# Patient Record
Sex: Female | Born: 1955 | Race: White | Hispanic: No | Marital: Married | Smoking: Current every day smoker
Health system: Southern US, Community
[De-identification: ages and names within clinical notes are randomized; demographics above are authoritative.]

## PROBLEM LIST (undated history)

## (undated) DIAGNOSIS — C801 Malignant (primary) neoplasm, unspecified: Secondary | ICD-10-CM

## (undated) DIAGNOSIS — E119 Type 2 diabetes mellitus without complications: Secondary | ICD-10-CM

## (undated) DIAGNOSIS — C50919 Malignant neoplasm of unspecified site of unspecified female breast: Secondary | ICD-10-CM

## (undated) DIAGNOSIS — E785 Hyperlipidemia, unspecified: Secondary | ICD-10-CM

## (undated) DIAGNOSIS — C8190 Hodgkin lymphoma, unspecified, unspecified site: Secondary | ICD-10-CM

## (undated) DIAGNOSIS — J449 Chronic obstructive pulmonary disease, unspecified: Secondary | ICD-10-CM

## (undated) HISTORY — DX: Hodgkin lymphoma, unspecified, unspecified site: C81.90

## (undated) HISTORY — DX: Malignant neoplasm of unspecified site of unspecified female breast: C50.919

## (undated) HISTORY — PX: MASTECTOMY: SHX3

## (undated) HISTORY — DX: Malignant (primary) neoplasm, unspecified: C80.1

## (undated) HISTORY — DX: Chronic obstructive pulmonary disease, unspecified: J44.9

## (undated) HISTORY — DX: Type 2 diabetes mellitus without complications: E11.9

## (undated) HISTORY — DX: Hyperlipidemia, unspecified: E78.5

## (undated) HISTORY — PX: CARPAL TUNNEL RELEASE: SHX101

## (undated) HISTORY — PX: LIMBAL STEM CELL TRANSPLANT: SHX1969

## (undated) HISTORY — PX: OVARIAN CYST SURGERY: SHX726

## (undated) HISTORY — PX: PORTA CATH INSERTION: CATH118285

---

## 2003-01-06 ENCOUNTER — Ambulatory Visit: Admission: RE | Admit: 2003-01-06 | Discharge: 2003-02-24 | Payer: Self-pay | Admitting: Radiation Oncology

## 2003-03-24 ENCOUNTER — Ambulatory Visit: Admission: RE | Admit: 2003-03-24 | Discharge: 2003-03-24 | Payer: Self-pay | Admitting: Family Medicine

## 2003-04-06 ENCOUNTER — Ambulatory Visit (HOSPITAL_COMMUNITY): Admission: RE | Admit: 2003-04-06 | Discharge: 2003-04-06 | Payer: Self-pay | Admitting: Oncology

## 2003-06-14 ENCOUNTER — Ambulatory Visit (HOSPITAL_COMMUNITY): Admission: RE | Admit: 2003-06-14 | Discharge: 2003-06-14 | Payer: Self-pay | Admitting: *Deleted

## 2004-04-30 ENCOUNTER — Ambulatory Visit: Payer: Self-pay | Admitting: Oncology

## 2005-06-11 IMAGING — CT CT CHEST W/ CM
1 of 4 series · 13 of 32 positions shown, 18 images · IV contrast (omnipaque)
Comparison: none

CLINICAL DATA: Hodgkin's lymphoma/staging.
 CT CHEST, ABDOMEN, AND PELVIS WITH CONTRAST
TECHNIQUE: Multidetector helical imaging carried out through the chest, abdomen, and pelvis utilizing oral and IV contrast (150 cc Omnipaque 300).  The patient has no prior CTs of these body parts available for comparison.  She did bring in a CD-ROM from [HOSPITAL], but that study is of the neck only.  
 CT CHEST WITH CONTRAST 
 There is anterior mediastinal adenopathy.   On image #12, there is a prevascular mass with a short axis length of about 22 mm.  On image #16, there is a rounded mass in the anterior mediastinum measuring about 25 mm in greatest diameter.  On image #23, there is an anterior mediastinal mass anterior to the ascending aorta with a short axis measurement of about 17 mm.   On image #29, there is a right infrahilar node measuring approximately 26 x 16 mm.  At this time, there is no pleural or paracardial fluid.  The lungs are clear except for an 11 x 14  mm density at the right lung base seen on image #38.  This is in the central lung base and becomes contiguous with the diaphragm on lower cuts.  This may be some "round atelectasis" or pneumonia at the right base, but lymphomatous involvement of the right lung cannot be excluded.  PET CT might be of value to further distinguish between these two entities.

[Series 4: a/p 5.0 b30f · axial · 0.74mm/px · z∈[-662,-272]mm · 13 of 90 slices shown, 18 images]
[im 6/90  soft-tissue]
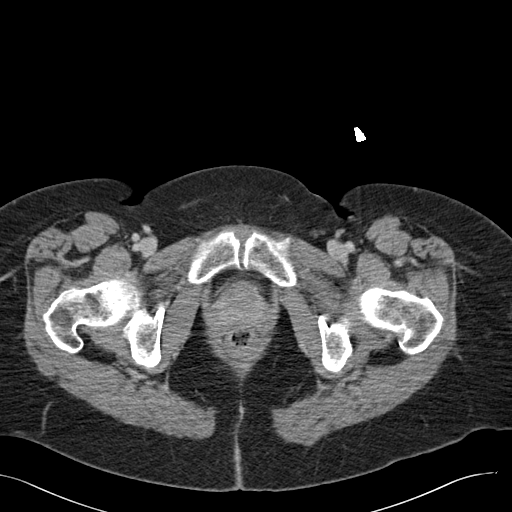
[im 6/90  bone]
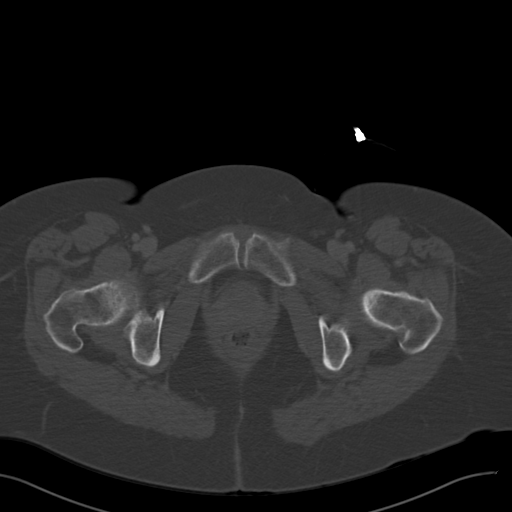
[im 12/90  soft-tissue]
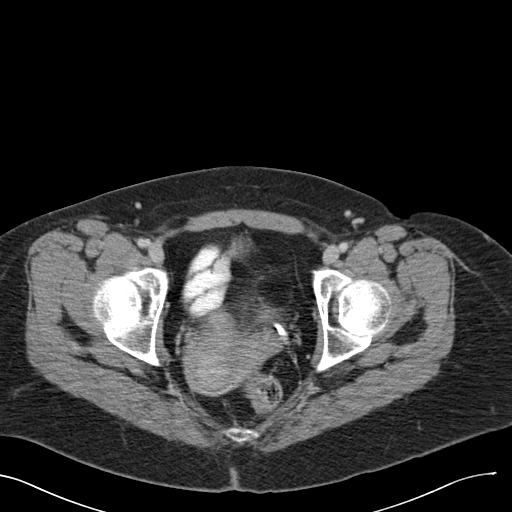
[im 18/90  soft-tissue]
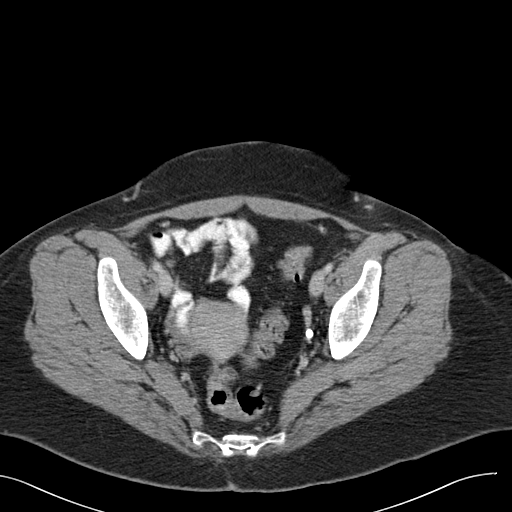
[im 30/90  soft-tissue]
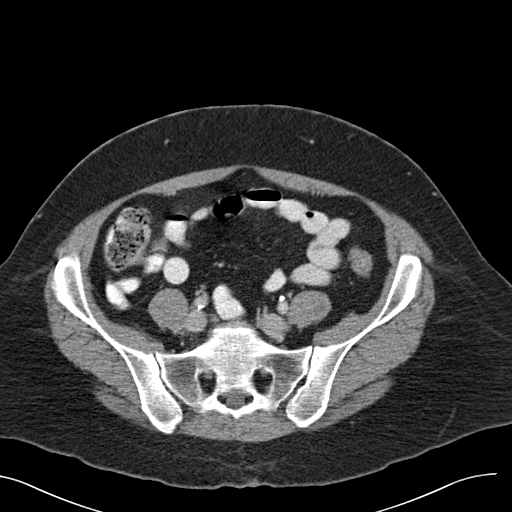
[im 36/90  soft-tissue]
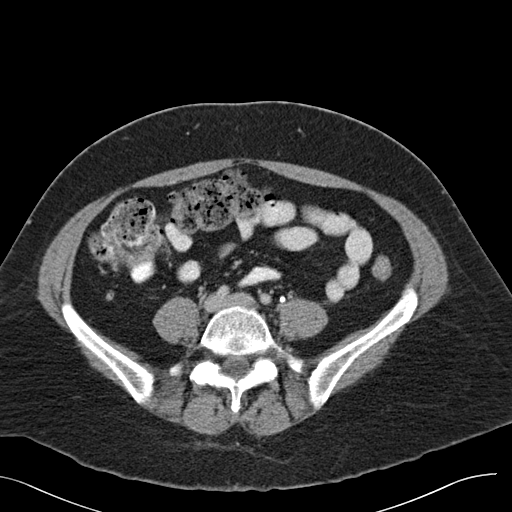
[im 42/90  soft-tissue]
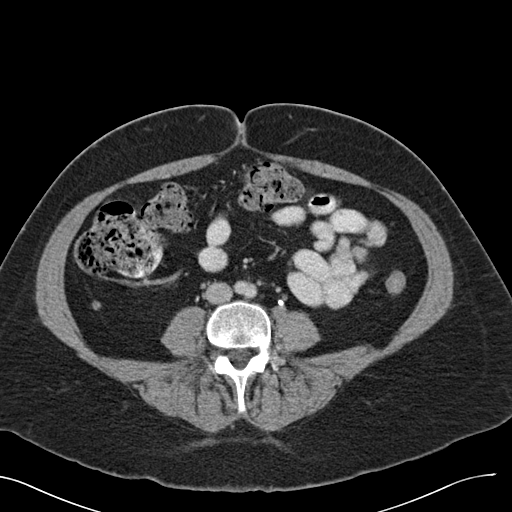
[im 48/90  soft-tissue]
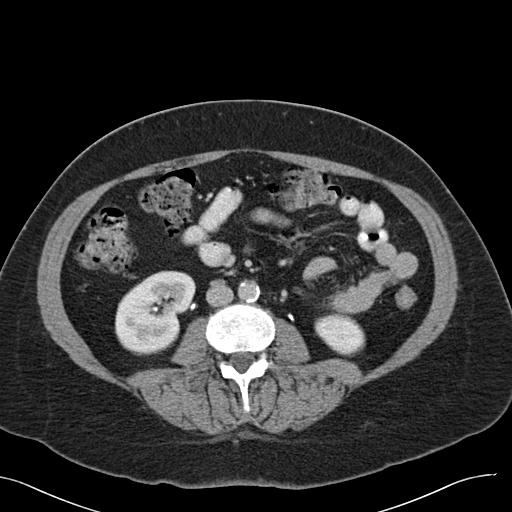
[im 54/90  soft-tissue]
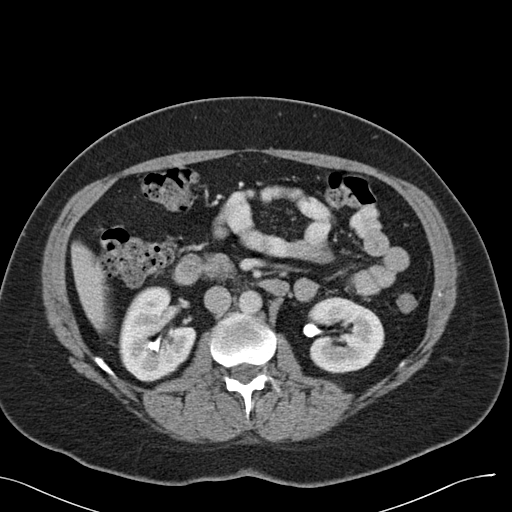
[im 60/90  soft-tissue]
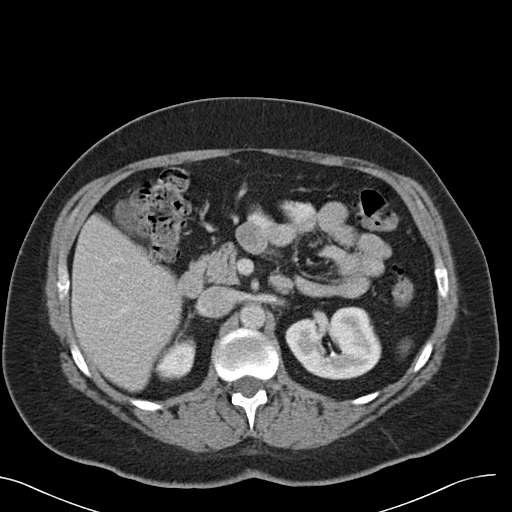
[im 60/90  bone]
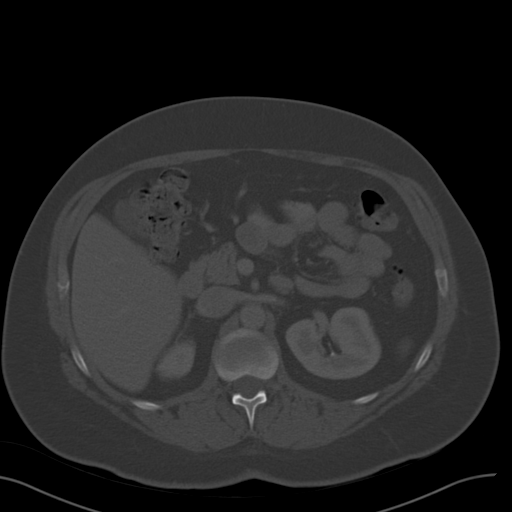
[im 66/90  lung]
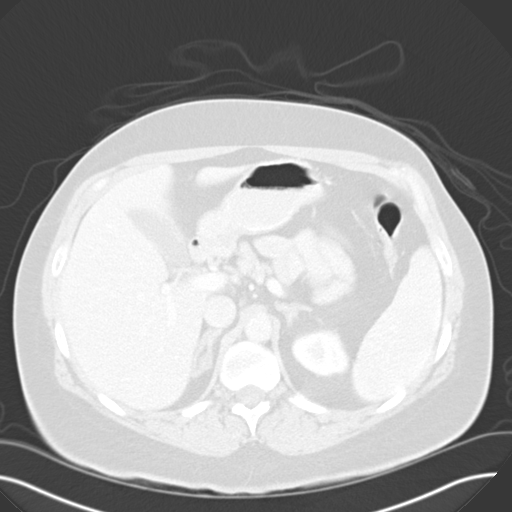
[im 72/90  soft-tissue]
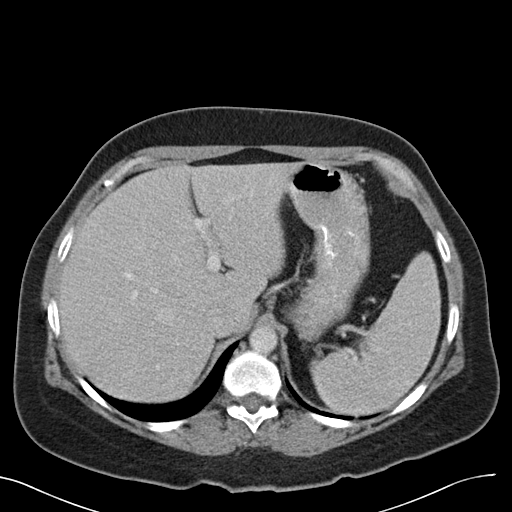
[im 72/90  lung]
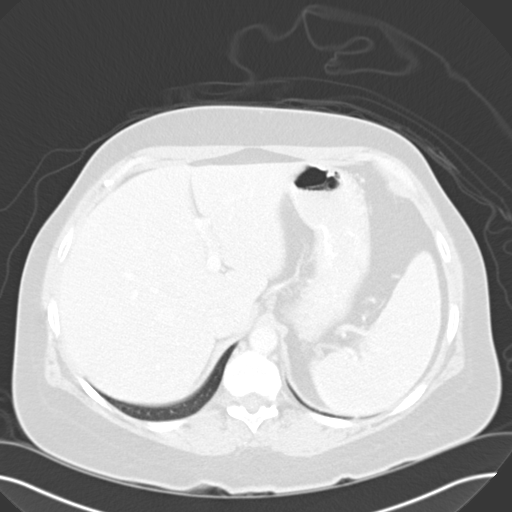
[im 78/90  soft-tissue]
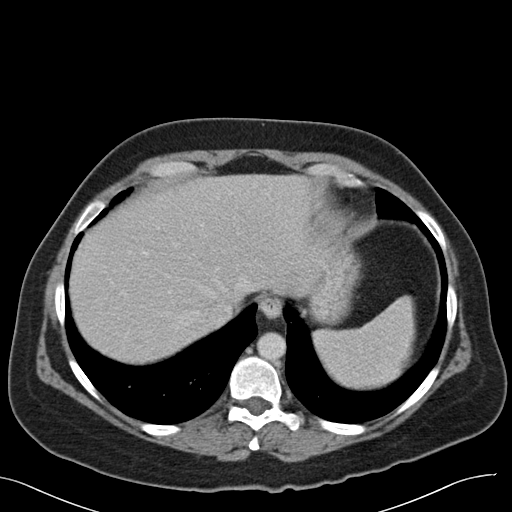
[im 78/90  lung]
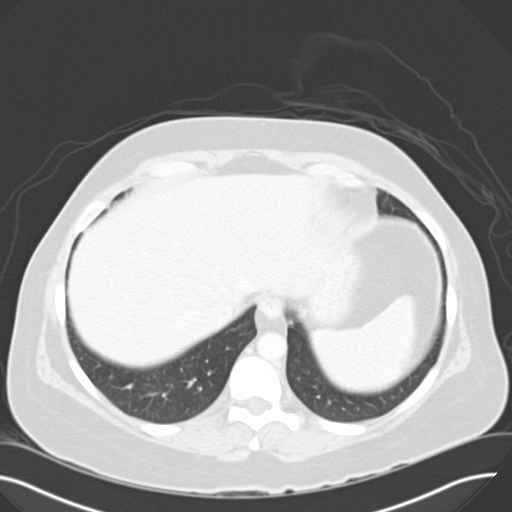
[im 84/90  soft-tissue]
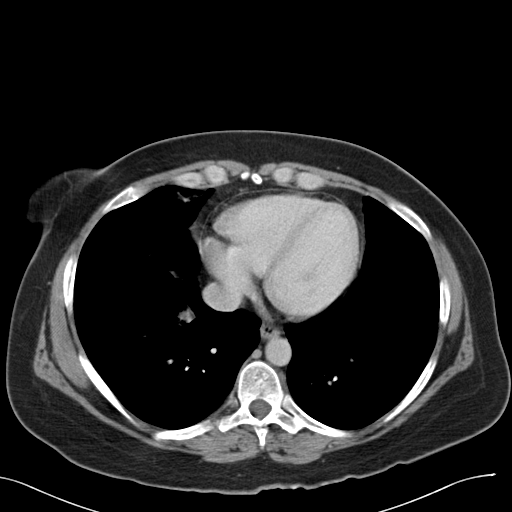
[im 84/90  lung]
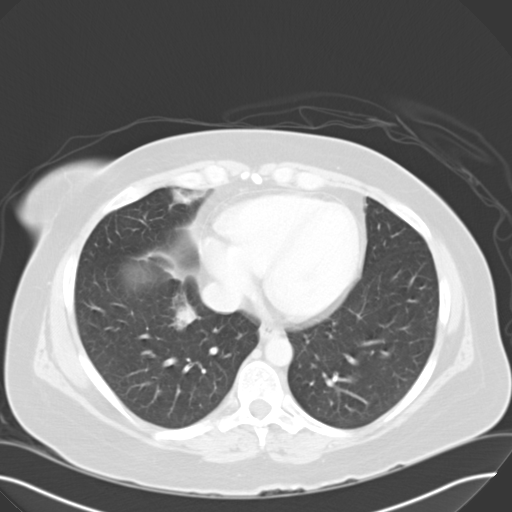

[13 of 32 positions shown; findings below may reference images not displayed]

One other observation, inadvertently not mentioned, is that there is also a subcarinal adenopathy.  This extends down along the posterior aspect of the right mainstem bronchus. 
 IMPRESSION
 1.  Mediastinal and right hilar/infrahilar adenopathy as described above.
 2.  Density at the right lung base -- question atelectasis/pneumonia versus lymphomatous lung involvement.
 CT ABDOMEN WITH CONTRAST 
 No focal lesions of the liver.   Spleen is within the normal limits of size and contour.  No calcified gallstones or biliary dilatation.  Pancreas and adrenals normal.  No retroperitoneal or mesenteric adenopathy.  No masses or ascites.   
 IMPRESSION
 No pathological findings in the abdomen.
 CT PELVIS WITH CONTRAST 
 No focal masses, adenopathy, or fluid collections.  No ureteral dilatation.  The appendix is noted to be normal.  No mesenteric masses. 
 IMPRESSION
 No pathological findings in the pelvis.

## 2006-02-21 ENCOUNTER — Ambulatory Visit: Payer: Self-pay | Admitting: Oncology

## 2006-08-08 ENCOUNTER — Ambulatory Visit: Payer: Self-pay | Admitting: Oncology

## 2009-05-29 ENCOUNTER — Ambulatory Visit (HOSPITAL_BASED_OUTPATIENT_CLINIC_OR_DEPARTMENT_OTHER): Admission: RE | Admit: 2009-05-29 | Discharge: 2009-05-29 | Payer: Self-pay | Admitting: Specialist

## 2010-08-12 LAB — GLUCOSE, CAPILLARY
Glucose-Capillary: 173 mg/dL — ABNORMAL HIGH (ref 70–99)
Glucose-Capillary: 179 mg/dL — ABNORMAL HIGH (ref 70–99)

## 2010-08-12 LAB — POCT HEMOGLOBIN-HEMACUE: Hemoglobin: 13.5 g/dL (ref 12.0–15.0)

## 2010-08-27 LAB — BASIC METABOLIC PANEL
BUN: 12 mg/dL (ref 6–23)
CO2: 27 mEq/L (ref 19–32)
Calcium: 9.4 mg/dL (ref 8.4–10.5)
Chloride: 100 mEq/L (ref 96–112)
Creatinine, Ser: 0.7 mg/dL (ref 0.4–1.2)
GFR calc Af Amer: >60 mL/min (ref >60)
GFR calc non Af Amer: >60 mL/min (ref >60)
Glucose, Bld: 224 mg/dL — ABNORMAL HIGH (ref 70–99)
Potassium: 4 mEq/L (ref 3.5–5.1)
Sodium: 134 mEq/L — ABNORMAL LOW (ref 135–145)

## 2016-04-17 DIAGNOSIS — F17210 Nicotine dependence, cigarettes, uncomplicated: Secondary | ICD-10-CM | POA: Diagnosis not present

## 2016-04-17 DIAGNOSIS — M899 Disorder of bone, unspecified: Secondary | ICD-10-CM | POA: Diagnosis not present

## 2016-04-17 DIAGNOSIS — Z8571 Personal history of Hodgkin lymphoma: Secondary | ICD-10-CM | POA: Diagnosis not present

## 2016-04-17 DIAGNOSIS — Z853 Personal history of malignant neoplasm of breast: Secondary | ICD-10-CM | POA: Diagnosis not present

## 2017-01-13 DIAGNOSIS — Z0000 Encounter for general adult medical examination without abnormal findings: Secondary | ICD-10-CM | POA: Diagnosis not present

## 2017-01-13 DIAGNOSIS — E785 Hyperlipidemia, unspecified: Secondary | ICD-10-CM | POA: Diagnosis not present

## 2017-01-13 DIAGNOSIS — L989 Disorder of the skin and subcutaneous tissue, unspecified: Secondary | ICD-10-CM | POA: Diagnosis not present

## 2017-01-13 DIAGNOSIS — E119 Type 2 diabetes mellitus without complications: Secondary | ICD-10-CM | POA: Diagnosis not present

## 2017-01-13 DIAGNOSIS — M1712 Unilateral primary osteoarthritis, left knee: Secondary | ICD-10-CM | POA: Diagnosis not present

## 2017-01-13 DIAGNOSIS — J449 Chronic obstructive pulmonary disease, unspecified: Secondary | ICD-10-CM | POA: Diagnosis not present

## 2017-01-13 DIAGNOSIS — M19011 Primary osteoarthritis, right shoulder: Secondary | ICD-10-CM | POA: Diagnosis not present

## 2017-01-13 DIAGNOSIS — G8911 Acute pain due to trauma: Secondary | ICD-10-CM | POA: Diagnosis not present

## 2017-01-13 DIAGNOSIS — R69 Illness, unspecified: Secondary | ICD-10-CM | POA: Diagnosis not present

## 2017-01-13 DIAGNOSIS — R221 Localized swelling, mass and lump, neck: Secondary | ICD-10-CM | POA: Diagnosis not present

## 2017-03-14 DIAGNOSIS — R69 Illness, unspecified: Secondary | ICD-10-CM | POA: Diagnosis not present

## 2017-04-16 DIAGNOSIS — Z1231 Encounter for screening mammogram for malignant neoplasm of breast: Secondary | ICD-10-CM | POA: Diagnosis not present

## 2017-04-30 DIAGNOSIS — N6489 Other specified disorders of breast: Secondary | ICD-10-CM | POA: Diagnosis not present

## 2017-04-30 DIAGNOSIS — N6311 Unspecified lump in the right breast, upper outer quadrant: Secondary | ICD-10-CM | POA: Diagnosis not present

## 2017-04-30 DIAGNOSIS — R928 Other abnormal and inconclusive findings on diagnostic imaging of breast: Secondary | ICD-10-CM | POA: Diagnosis not present

## 2017-04-30 DIAGNOSIS — R922 Inconclusive mammogram: Secondary | ICD-10-CM | POA: Diagnosis not present

## 2017-05-07 DIAGNOSIS — Z9012 Acquired absence of left breast and nipple: Secondary | ICD-10-CM | POA: Diagnosis not present

## 2017-05-07 DIAGNOSIS — C50411 Malignant neoplasm of upper-outer quadrant of right female breast: Secondary | ICD-10-CM | POA: Diagnosis not present

## 2017-05-07 DIAGNOSIS — Z8571 Personal history of Hodgkin lymphoma: Secondary | ICD-10-CM | POA: Diagnosis not present

## 2017-05-07 DIAGNOSIS — N6311 Unspecified lump in the right breast, upper outer quadrant: Secondary | ICD-10-CM | POA: Diagnosis not present

## 2017-05-13 DIAGNOSIS — C50411 Malignant neoplasm of upper-outer quadrant of right female breast: Secondary | ICD-10-CM | POA: Diagnosis not present

## 2017-05-13 DIAGNOSIS — Z170 Estrogen receptor positive status [ER+]: Secondary | ICD-10-CM | POA: Diagnosis not present

## 2017-05-23 DIAGNOSIS — E079 Disorder of thyroid, unspecified: Secondary | ICD-10-CM | POA: Diagnosis not present

## 2017-05-23 DIAGNOSIS — E7800 Pure hypercholesterolemia, unspecified: Secondary | ICD-10-CM | POA: Diagnosis not present

## 2017-05-23 DIAGNOSIS — E1122 Type 2 diabetes mellitus with diabetic chronic kidney disease: Secondary | ICD-10-CM | POA: Diagnosis not present

## 2017-05-23 DIAGNOSIS — C50911 Malignant neoplasm of unspecified site of right female breast: Secondary | ICD-10-CM | POA: Diagnosis not present

## 2017-05-23 DIAGNOSIS — C50919 Malignant neoplasm of unspecified site of unspecified female breast: Secondary | ICD-10-CM | POA: Diagnosis not present

## 2017-05-23 DIAGNOSIS — I129 Hypertensive chronic kidney disease with stage 1 through stage 4 chronic kidney disease, or unspecified chronic kidney disease: Secondary | ICD-10-CM | POA: Diagnosis not present

## 2017-05-23 DIAGNOSIS — I10 Essential (primary) hypertension: Secondary | ICD-10-CM | POA: Diagnosis not present

## 2017-05-23 DIAGNOSIS — Z170 Estrogen receptor positive status [ER+]: Secondary | ICD-10-CM | POA: Diagnosis not present

## 2017-05-23 DIAGNOSIS — E119 Type 2 diabetes mellitus without complications: Secondary | ICD-10-CM | POA: Diagnosis not present

## 2017-05-23 DIAGNOSIS — D696 Thrombocytopenia, unspecified: Secondary | ICD-10-CM | POA: Diagnosis not present

## 2017-05-23 DIAGNOSIS — E559 Vitamin D deficiency, unspecified: Secondary | ICD-10-CM | POA: Diagnosis not present

## 2017-05-23 DIAGNOSIS — J449 Chronic obstructive pulmonary disease, unspecified: Secondary | ICD-10-CM | POA: Diagnosis not present

## 2017-05-23 DIAGNOSIS — C50411 Malignant neoplasm of upper-outer quadrant of right female breast: Secondary | ICD-10-CM | POA: Diagnosis not present

## 2017-05-23 DIAGNOSIS — N182 Chronic kidney disease, stage 2 (mild): Secondary | ICD-10-CM | POA: Diagnosis not present

## 2017-06-12 DIAGNOSIS — Z923 Personal history of irradiation: Secondary | ICD-10-CM | POA: Diagnosis not present

## 2017-06-12 DIAGNOSIS — Z8571 Personal history of Hodgkin lymphoma: Secondary | ICD-10-CM | POA: Diagnosis not present

## 2017-06-12 DIAGNOSIS — F17210 Nicotine dependence, cigarettes, uncomplicated: Secondary | ICD-10-CM | POA: Diagnosis not present

## 2017-06-12 DIAGNOSIS — Z9221 Personal history of antineoplastic chemotherapy: Secondary | ICD-10-CM | POA: Diagnosis not present

## 2017-06-12 DIAGNOSIS — R69 Illness, unspecified: Secondary | ICD-10-CM | POA: Diagnosis not present

## 2017-06-12 DIAGNOSIS — Z9484 Stem cells transplant status: Secondary | ICD-10-CM | POA: Diagnosis not present

## 2017-06-12 DIAGNOSIS — C50911 Malignant neoplasm of unspecified site of right female breast: Secondary | ICD-10-CM | POA: Diagnosis not present

## 2017-06-12 DIAGNOSIS — E871 Hypo-osmolality and hyponatremia: Secondary | ICD-10-CM | POA: Diagnosis not present

## 2017-06-12 DIAGNOSIS — Z853 Personal history of malignant neoplasm of breast: Secondary | ICD-10-CM | POA: Diagnosis not present

## 2017-06-12 DIAGNOSIS — M8580 Other specified disorders of bone density and structure, unspecified site: Secondary | ICD-10-CM | POA: Diagnosis not present

## 2017-06-12 DIAGNOSIS — E1165 Type 2 diabetes mellitus with hyperglycemia: Secondary | ICD-10-CM | POA: Diagnosis not present

## 2017-06-23 DIAGNOSIS — M8589 Other specified disorders of bone density and structure, multiple sites: Secondary | ICD-10-CM | POA: Diagnosis not present

## 2017-06-27 DIAGNOSIS — Z170 Estrogen receptor positive status [ER+]: Secondary | ICD-10-CM

## 2017-06-27 DIAGNOSIS — R69 Illness, unspecified: Secondary | ICD-10-CM | POA: Diagnosis not present

## 2017-06-27 DIAGNOSIS — M8580 Other specified disorders of bone density and structure, unspecified site: Secondary | ICD-10-CM | POA: Diagnosis not present

## 2017-06-27 DIAGNOSIS — E119 Type 2 diabetes mellitus without complications: Secondary | ICD-10-CM | POA: Diagnosis not present

## 2017-06-27 DIAGNOSIS — C50411 Malignant neoplasm of upper-outer quadrant of right female breast: Secondary | ICD-10-CM | POA: Diagnosis not present

## 2017-06-27 DIAGNOSIS — Z8571 Personal history of Hodgkin lymphoma: Secondary | ICD-10-CM

## 2017-06-27 DIAGNOSIS — E1165 Type 2 diabetes mellitus with hyperglycemia: Secondary | ICD-10-CM | POA: Diagnosis not present

## 2017-06-27 DIAGNOSIS — C50911 Malignant neoplasm of unspecified site of right female breast: Secondary | ICD-10-CM | POA: Diagnosis not present

## 2017-06-27 DIAGNOSIS — Z853 Personal history of malignant neoplasm of breast: Secondary | ICD-10-CM | POA: Diagnosis not present

## 2017-06-27 DIAGNOSIS — F17210 Nicotine dependence, cigarettes, uncomplicated: Secondary | ICD-10-CM | POA: Diagnosis not present

## 2017-06-27 DIAGNOSIS — M8589 Other specified disorders of bone density and structure, multiple sites: Secondary | ICD-10-CM | POA: Diagnosis not present

## 2017-08-21 DIAGNOSIS — M6281 Muscle weakness (generalized): Secondary | ICD-10-CM | POA: Diagnosis not present

## 2017-08-21 DIAGNOSIS — M25612 Stiffness of left shoulder, not elsewhere classified: Secondary | ICD-10-CM | POA: Diagnosis not present

## 2017-08-21 DIAGNOSIS — R5383 Other fatigue: Secondary | ICD-10-CM | POA: Diagnosis not present

## 2017-08-21 DIAGNOSIS — C50411 Malignant neoplasm of upper-outer quadrant of right female breast: Secondary | ICD-10-CM | POA: Diagnosis not present

## 2017-08-21 DIAGNOSIS — R2681 Unsteadiness on feet: Secondary | ICD-10-CM | POA: Diagnosis not present

## 2017-08-21 DIAGNOSIS — M25611 Stiffness of right shoulder, not elsewhere classified: Secondary | ICD-10-CM | POA: Diagnosis not present

## 2017-08-21 DIAGNOSIS — R2689 Other abnormalities of gait and mobility: Secondary | ICD-10-CM | POA: Diagnosis not present

## 2017-08-21 DIAGNOSIS — R293 Abnormal posture: Secondary | ICD-10-CM | POA: Diagnosis not present

## 2017-08-26 DIAGNOSIS — M25612 Stiffness of left shoulder, not elsewhere classified: Secondary | ICD-10-CM | POA: Diagnosis not present

## 2017-08-26 DIAGNOSIS — R2689 Other abnormalities of gait and mobility: Secondary | ICD-10-CM | POA: Diagnosis not present

## 2017-08-26 DIAGNOSIS — R2681 Unsteadiness on feet: Secondary | ICD-10-CM | POA: Diagnosis not present

## 2017-08-26 DIAGNOSIS — R5383 Other fatigue: Secondary | ICD-10-CM | POA: Diagnosis not present

## 2017-08-26 DIAGNOSIS — C50411 Malignant neoplasm of upper-outer quadrant of right female breast: Secondary | ICD-10-CM | POA: Diagnosis not present

## 2017-08-26 DIAGNOSIS — M6281 Muscle weakness (generalized): Secondary | ICD-10-CM | POA: Diagnosis not present

## 2017-08-26 DIAGNOSIS — R293 Abnormal posture: Secondary | ICD-10-CM | POA: Diagnosis not present

## 2017-08-26 DIAGNOSIS — M25611 Stiffness of right shoulder, not elsewhere classified: Secondary | ICD-10-CM | POA: Diagnosis not present

## 2017-08-28 DIAGNOSIS — R2689 Other abnormalities of gait and mobility: Secondary | ICD-10-CM | POA: Diagnosis not present

## 2017-08-28 DIAGNOSIS — R2681 Unsteadiness on feet: Secondary | ICD-10-CM | POA: Diagnosis not present

## 2017-08-28 DIAGNOSIS — M25612 Stiffness of left shoulder, not elsewhere classified: Secondary | ICD-10-CM | POA: Diagnosis not present

## 2017-08-28 DIAGNOSIS — M6281 Muscle weakness (generalized): Secondary | ICD-10-CM | POA: Diagnosis not present

## 2017-08-28 DIAGNOSIS — M25611 Stiffness of right shoulder, not elsewhere classified: Secondary | ICD-10-CM | POA: Diagnosis not present

## 2017-08-28 DIAGNOSIS — C50411 Malignant neoplasm of upper-outer quadrant of right female breast: Secondary | ICD-10-CM | POA: Diagnosis not present

## 2017-08-28 DIAGNOSIS — R5383 Other fatigue: Secondary | ICD-10-CM | POA: Diagnosis not present

## 2017-08-28 DIAGNOSIS — R293 Abnormal posture: Secondary | ICD-10-CM | POA: Diagnosis not present

## 2017-09-02 DIAGNOSIS — C50411 Malignant neoplasm of upper-outer quadrant of right female breast: Secondary | ICD-10-CM | POA: Diagnosis not present

## 2017-09-02 DIAGNOSIS — R2689 Other abnormalities of gait and mobility: Secondary | ICD-10-CM | POA: Diagnosis not present

## 2017-09-02 DIAGNOSIS — R293 Abnormal posture: Secondary | ICD-10-CM | POA: Diagnosis not present

## 2017-09-02 DIAGNOSIS — M25612 Stiffness of left shoulder, not elsewhere classified: Secondary | ICD-10-CM | POA: Diagnosis not present

## 2017-09-02 DIAGNOSIS — M6281 Muscle weakness (generalized): Secondary | ICD-10-CM | POA: Diagnosis not present

## 2017-09-02 DIAGNOSIS — R5383 Other fatigue: Secondary | ICD-10-CM | POA: Diagnosis not present

## 2017-09-02 DIAGNOSIS — M25611 Stiffness of right shoulder, not elsewhere classified: Secondary | ICD-10-CM | POA: Diagnosis not present

## 2017-09-02 DIAGNOSIS — R2681 Unsteadiness on feet: Secondary | ICD-10-CM | POA: Diagnosis not present

## 2017-09-05 DIAGNOSIS — M25612 Stiffness of left shoulder, not elsewhere classified: Secondary | ICD-10-CM | POA: Diagnosis not present

## 2017-09-05 DIAGNOSIS — R5383 Other fatigue: Secondary | ICD-10-CM | POA: Diagnosis not present

## 2017-09-05 DIAGNOSIS — C50411 Malignant neoplasm of upper-outer quadrant of right female breast: Secondary | ICD-10-CM | POA: Diagnosis not present

## 2017-09-05 DIAGNOSIS — R2689 Other abnormalities of gait and mobility: Secondary | ICD-10-CM | POA: Diagnosis not present

## 2017-09-05 DIAGNOSIS — M25611 Stiffness of right shoulder, not elsewhere classified: Secondary | ICD-10-CM | POA: Diagnosis not present

## 2017-09-05 DIAGNOSIS — M6281 Muscle weakness (generalized): Secondary | ICD-10-CM | POA: Diagnosis not present

## 2017-09-05 DIAGNOSIS — R2681 Unsteadiness on feet: Secondary | ICD-10-CM | POA: Diagnosis not present

## 2017-09-05 DIAGNOSIS — R293 Abnormal posture: Secondary | ICD-10-CM | POA: Diagnosis not present

## 2017-09-09 DIAGNOSIS — M6281 Muscle weakness (generalized): Secondary | ICD-10-CM | POA: Diagnosis not present

## 2017-09-09 DIAGNOSIS — M25611 Stiffness of right shoulder, not elsewhere classified: Secondary | ICD-10-CM | POA: Diagnosis not present

## 2017-09-09 DIAGNOSIS — M25612 Stiffness of left shoulder, not elsewhere classified: Secondary | ICD-10-CM | POA: Diagnosis not present

## 2017-09-09 DIAGNOSIS — C50411 Malignant neoplasm of upper-outer quadrant of right female breast: Secondary | ICD-10-CM | POA: Diagnosis not present

## 2017-09-09 DIAGNOSIS — R2689 Other abnormalities of gait and mobility: Secondary | ICD-10-CM | POA: Diagnosis not present

## 2017-09-09 DIAGNOSIS — R293 Abnormal posture: Secondary | ICD-10-CM | POA: Diagnosis not present

## 2017-09-09 DIAGNOSIS — R2681 Unsteadiness on feet: Secondary | ICD-10-CM | POA: Diagnosis not present

## 2017-09-09 DIAGNOSIS — R5383 Other fatigue: Secondary | ICD-10-CM | POA: Diagnosis not present

## 2017-09-18 DIAGNOSIS — C50411 Malignant neoplasm of upper-outer quadrant of right female breast: Secondary | ICD-10-CM | POA: Diagnosis not present

## 2017-09-18 DIAGNOSIS — M25611 Stiffness of right shoulder, not elsewhere classified: Secondary | ICD-10-CM | POA: Diagnosis not present

## 2017-09-18 DIAGNOSIS — M25612 Stiffness of left shoulder, not elsewhere classified: Secondary | ICD-10-CM | POA: Diagnosis not present

## 2017-09-18 DIAGNOSIS — M6281 Muscle weakness (generalized): Secondary | ICD-10-CM | POA: Diagnosis not present

## 2017-09-18 DIAGNOSIS — R2689 Other abnormalities of gait and mobility: Secondary | ICD-10-CM | POA: Diagnosis not present

## 2017-09-18 DIAGNOSIS — R5383 Other fatigue: Secondary | ICD-10-CM | POA: Diagnosis not present

## 2017-09-18 DIAGNOSIS — R293 Abnormal posture: Secondary | ICD-10-CM | POA: Diagnosis not present

## 2017-09-18 DIAGNOSIS — R2681 Unsteadiness on feet: Secondary | ICD-10-CM | POA: Diagnosis not present

## 2017-09-24 DIAGNOSIS — M25612 Stiffness of left shoulder, not elsewhere classified: Secondary | ICD-10-CM | POA: Diagnosis not present

## 2017-09-24 DIAGNOSIS — C50411 Malignant neoplasm of upper-outer quadrant of right female breast: Secondary | ICD-10-CM | POA: Diagnosis not present

## 2017-09-24 DIAGNOSIS — R293 Abnormal posture: Secondary | ICD-10-CM | POA: Diagnosis not present

## 2017-09-24 DIAGNOSIS — R2681 Unsteadiness on feet: Secondary | ICD-10-CM | POA: Diagnosis not present

## 2017-09-24 DIAGNOSIS — R2689 Other abnormalities of gait and mobility: Secondary | ICD-10-CM | POA: Diagnosis not present

## 2017-09-24 DIAGNOSIS — M25611 Stiffness of right shoulder, not elsewhere classified: Secondary | ICD-10-CM | POA: Diagnosis not present

## 2017-09-24 DIAGNOSIS — R5383 Other fatigue: Secondary | ICD-10-CM | POA: Diagnosis not present

## 2017-09-24 DIAGNOSIS — M6281 Muscle weakness (generalized): Secondary | ICD-10-CM | POA: Diagnosis not present

## 2017-09-26 DIAGNOSIS — M6281 Muscle weakness (generalized): Secondary | ICD-10-CM | POA: Diagnosis not present

## 2017-09-26 DIAGNOSIS — C50411 Malignant neoplasm of upper-outer quadrant of right female breast: Secondary | ICD-10-CM | POA: Diagnosis not present

## 2017-09-26 DIAGNOSIS — M25611 Stiffness of right shoulder, not elsewhere classified: Secondary | ICD-10-CM | POA: Diagnosis not present

## 2017-09-26 DIAGNOSIS — R2689 Other abnormalities of gait and mobility: Secondary | ICD-10-CM | POA: Diagnosis not present

## 2017-09-26 DIAGNOSIS — M25612 Stiffness of left shoulder, not elsewhere classified: Secondary | ICD-10-CM | POA: Diagnosis not present

## 2017-09-26 DIAGNOSIS — R293 Abnormal posture: Secondary | ICD-10-CM | POA: Diagnosis not present

## 2017-09-26 DIAGNOSIS — R2681 Unsteadiness on feet: Secondary | ICD-10-CM | POA: Diagnosis not present

## 2017-09-26 DIAGNOSIS — R5383 Other fatigue: Secondary | ICD-10-CM | POA: Diagnosis not present

## 2017-10-01 DIAGNOSIS — R293 Abnormal posture: Secondary | ICD-10-CM | POA: Diagnosis not present

## 2017-10-01 DIAGNOSIS — M25612 Stiffness of left shoulder, not elsewhere classified: Secondary | ICD-10-CM | POA: Diagnosis not present

## 2017-10-01 DIAGNOSIS — R5383 Other fatigue: Secondary | ICD-10-CM | POA: Diagnosis not present

## 2017-10-01 DIAGNOSIS — M25611 Stiffness of right shoulder, not elsewhere classified: Secondary | ICD-10-CM | POA: Diagnosis not present

## 2017-10-01 DIAGNOSIS — R2689 Other abnormalities of gait and mobility: Secondary | ICD-10-CM | POA: Diagnosis not present

## 2017-10-01 DIAGNOSIS — R2681 Unsteadiness on feet: Secondary | ICD-10-CM | POA: Diagnosis not present

## 2017-10-01 DIAGNOSIS — C50411 Malignant neoplasm of upper-outer quadrant of right female breast: Secondary | ICD-10-CM | POA: Diagnosis not present

## 2017-10-01 DIAGNOSIS — M6281 Muscle weakness (generalized): Secondary | ICD-10-CM | POA: Diagnosis not present

## 2017-10-03 DIAGNOSIS — M6281 Muscle weakness (generalized): Secondary | ICD-10-CM | POA: Diagnosis not present

## 2017-10-03 DIAGNOSIS — R2689 Other abnormalities of gait and mobility: Secondary | ICD-10-CM | POA: Diagnosis not present

## 2017-10-03 DIAGNOSIS — R5383 Other fatigue: Secondary | ICD-10-CM | POA: Diagnosis not present

## 2017-10-03 DIAGNOSIS — R2681 Unsteadiness on feet: Secondary | ICD-10-CM | POA: Diagnosis not present

## 2017-10-03 DIAGNOSIS — C50411 Malignant neoplasm of upper-outer quadrant of right female breast: Secondary | ICD-10-CM | POA: Diagnosis not present

## 2017-10-03 DIAGNOSIS — R293 Abnormal posture: Secondary | ICD-10-CM | POA: Diagnosis not present

## 2017-10-03 DIAGNOSIS — M25611 Stiffness of right shoulder, not elsewhere classified: Secondary | ICD-10-CM | POA: Diagnosis not present

## 2017-10-03 DIAGNOSIS — M25612 Stiffness of left shoulder, not elsewhere classified: Secondary | ICD-10-CM | POA: Diagnosis not present

## 2017-10-08 DIAGNOSIS — R5383 Other fatigue: Secondary | ICD-10-CM | POA: Diagnosis not present

## 2017-10-08 DIAGNOSIS — R2681 Unsteadiness on feet: Secondary | ICD-10-CM | POA: Diagnosis not present

## 2017-10-08 DIAGNOSIS — R293 Abnormal posture: Secondary | ICD-10-CM | POA: Diagnosis not present

## 2017-10-08 DIAGNOSIS — C50411 Malignant neoplasm of upper-outer quadrant of right female breast: Secondary | ICD-10-CM | POA: Diagnosis not present

## 2017-10-08 DIAGNOSIS — M25611 Stiffness of right shoulder, not elsewhere classified: Secondary | ICD-10-CM | POA: Diagnosis not present

## 2017-10-08 DIAGNOSIS — M25612 Stiffness of left shoulder, not elsewhere classified: Secondary | ICD-10-CM | POA: Diagnosis not present

## 2017-10-08 DIAGNOSIS — M6281 Muscle weakness (generalized): Secondary | ICD-10-CM | POA: Diagnosis not present

## 2017-10-08 DIAGNOSIS — R2689 Other abnormalities of gait and mobility: Secondary | ICD-10-CM | POA: Diagnosis not present

## 2017-10-15 DIAGNOSIS — C50411 Malignant neoplasm of upper-outer quadrant of right female breast: Secondary | ICD-10-CM | POA: Diagnosis not present

## 2017-10-15 DIAGNOSIS — R2681 Unsteadiness on feet: Secondary | ICD-10-CM | POA: Diagnosis not present

## 2017-10-15 DIAGNOSIS — R2689 Other abnormalities of gait and mobility: Secondary | ICD-10-CM | POA: Diagnosis not present

## 2017-10-15 DIAGNOSIS — R5383 Other fatigue: Secondary | ICD-10-CM | POA: Diagnosis not present

## 2017-10-15 DIAGNOSIS — M25612 Stiffness of left shoulder, not elsewhere classified: Secondary | ICD-10-CM | POA: Diagnosis not present

## 2017-10-15 DIAGNOSIS — M25611 Stiffness of right shoulder, not elsewhere classified: Secondary | ICD-10-CM | POA: Diagnosis not present

## 2017-10-15 DIAGNOSIS — R293 Abnormal posture: Secondary | ICD-10-CM | POA: Diagnosis not present

## 2017-10-15 DIAGNOSIS — M6281 Muscle weakness (generalized): Secondary | ICD-10-CM | POA: Diagnosis not present

## 2017-10-17 DIAGNOSIS — R5383 Other fatigue: Secondary | ICD-10-CM | POA: Diagnosis not present

## 2017-10-17 DIAGNOSIS — C50411 Malignant neoplasm of upper-outer quadrant of right female breast: Secondary | ICD-10-CM | POA: Diagnosis not present

## 2017-10-17 DIAGNOSIS — R2689 Other abnormalities of gait and mobility: Secondary | ICD-10-CM | POA: Diagnosis not present

## 2017-10-17 DIAGNOSIS — M25612 Stiffness of left shoulder, not elsewhere classified: Secondary | ICD-10-CM | POA: Diagnosis not present

## 2017-10-17 DIAGNOSIS — M6281 Muscle weakness (generalized): Secondary | ICD-10-CM | POA: Diagnosis not present

## 2017-10-17 DIAGNOSIS — M25611 Stiffness of right shoulder, not elsewhere classified: Secondary | ICD-10-CM | POA: Diagnosis not present

## 2017-10-17 DIAGNOSIS — R2681 Unsteadiness on feet: Secondary | ICD-10-CM | POA: Diagnosis not present

## 2017-10-17 DIAGNOSIS — R293 Abnormal posture: Secondary | ICD-10-CM | POA: Diagnosis not present

## 2017-10-21 DIAGNOSIS — R2689 Other abnormalities of gait and mobility: Secondary | ICD-10-CM | POA: Diagnosis not present

## 2017-10-21 DIAGNOSIS — M25612 Stiffness of left shoulder, not elsewhere classified: Secondary | ICD-10-CM | POA: Diagnosis not present

## 2017-10-21 DIAGNOSIS — R293 Abnormal posture: Secondary | ICD-10-CM | POA: Diagnosis not present

## 2017-10-21 DIAGNOSIS — R5383 Other fatigue: Secondary | ICD-10-CM | POA: Diagnosis not present

## 2017-10-21 DIAGNOSIS — C50411 Malignant neoplasm of upper-outer quadrant of right female breast: Secondary | ICD-10-CM | POA: Diagnosis not present

## 2017-10-21 DIAGNOSIS — M25611 Stiffness of right shoulder, not elsewhere classified: Secondary | ICD-10-CM | POA: Diagnosis not present

## 2017-10-21 DIAGNOSIS — R2681 Unsteadiness on feet: Secondary | ICD-10-CM | POA: Diagnosis not present

## 2017-10-21 DIAGNOSIS — M6281 Muscle weakness (generalized): Secondary | ICD-10-CM | POA: Diagnosis not present

## 2017-10-29 DIAGNOSIS — R293 Abnormal posture: Secondary | ICD-10-CM | POA: Diagnosis not present

## 2017-10-29 DIAGNOSIS — M25611 Stiffness of right shoulder, not elsewhere classified: Secondary | ICD-10-CM | POA: Diagnosis not present

## 2017-10-29 DIAGNOSIS — M25612 Stiffness of left shoulder, not elsewhere classified: Secondary | ICD-10-CM | POA: Diagnosis not present

## 2017-10-29 DIAGNOSIS — C50411 Malignant neoplasm of upper-outer quadrant of right female breast: Secondary | ICD-10-CM | POA: Diagnosis not present

## 2017-10-29 DIAGNOSIS — R2681 Unsteadiness on feet: Secondary | ICD-10-CM | POA: Diagnosis not present

## 2017-10-29 DIAGNOSIS — M6281 Muscle weakness (generalized): Secondary | ICD-10-CM | POA: Diagnosis not present

## 2017-10-29 DIAGNOSIS — R5383 Other fatigue: Secondary | ICD-10-CM | POA: Diagnosis not present

## 2017-10-29 DIAGNOSIS — R2689 Other abnormalities of gait and mobility: Secondary | ICD-10-CM | POA: Diagnosis not present

## 2017-11-03 DIAGNOSIS — J302 Other seasonal allergic rhinitis: Secondary | ICD-10-CM | POA: Diagnosis not present

## 2017-11-03 DIAGNOSIS — G8929 Other chronic pain: Secondary | ICD-10-CM | POA: Diagnosis not present

## 2017-11-03 DIAGNOSIS — E099 Drug or chemical induced diabetes mellitus without complications: Secondary | ICD-10-CM | POA: Diagnosis not present

## 2017-11-03 DIAGNOSIS — C50912 Malignant neoplasm of unspecified site of left female breast: Secondary | ICD-10-CM | POA: Diagnosis not present

## 2017-11-03 DIAGNOSIS — M8580 Other specified disorders of bone density and structure, unspecified site: Secondary | ICD-10-CM | POA: Diagnosis not present

## 2017-11-03 DIAGNOSIS — R69 Illness, unspecified: Secondary | ICD-10-CM | POA: Diagnosis not present

## 2017-11-03 DIAGNOSIS — Z9484 Stem cells transplant status: Secondary | ICD-10-CM | POA: Diagnosis not present

## 2017-11-03 DIAGNOSIS — C8590 Non-Hodgkin lymphoma, unspecified, unspecified site: Secondary | ICD-10-CM | POA: Diagnosis not present

## 2017-11-03 DIAGNOSIS — C50911 Malignant neoplasm of unspecified site of right female breast: Secondary | ICD-10-CM | POA: Diagnosis not present

## 2017-11-03 DIAGNOSIS — J449 Chronic obstructive pulmonary disease, unspecified: Secondary | ICD-10-CM | POA: Diagnosis not present

## 2017-11-05 DIAGNOSIS — R2689 Other abnormalities of gait and mobility: Secondary | ICD-10-CM | POA: Diagnosis not present

## 2017-11-05 DIAGNOSIS — C50411 Malignant neoplasm of upper-outer quadrant of right female breast: Secondary | ICD-10-CM | POA: Diagnosis not present

## 2017-11-05 DIAGNOSIS — R2681 Unsteadiness on feet: Secondary | ICD-10-CM | POA: Diagnosis not present

## 2017-11-05 DIAGNOSIS — M6281 Muscle weakness (generalized): Secondary | ICD-10-CM | POA: Diagnosis not present

## 2017-11-05 DIAGNOSIS — M25612 Stiffness of left shoulder, not elsewhere classified: Secondary | ICD-10-CM | POA: Diagnosis not present

## 2017-11-05 DIAGNOSIS — R5383 Other fatigue: Secondary | ICD-10-CM | POA: Diagnosis not present

## 2017-11-05 DIAGNOSIS — M25611 Stiffness of right shoulder, not elsewhere classified: Secondary | ICD-10-CM | POA: Diagnosis not present

## 2017-11-05 DIAGNOSIS — R293 Abnormal posture: Secondary | ICD-10-CM | POA: Diagnosis not present

## 2017-11-12 DIAGNOSIS — C50411 Malignant neoplasm of upper-outer quadrant of right female breast: Secondary | ICD-10-CM | POA: Diagnosis not present

## 2017-11-12 DIAGNOSIS — R2689 Other abnormalities of gait and mobility: Secondary | ICD-10-CM | POA: Diagnosis not present

## 2017-11-12 DIAGNOSIS — R5383 Other fatigue: Secondary | ICD-10-CM | POA: Diagnosis not present

## 2017-11-12 DIAGNOSIS — M25612 Stiffness of left shoulder, not elsewhere classified: Secondary | ICD-10-CM | POA: Diagnosis not present

## 2017-11-12 DIAGNOSIS — R293 Abnormal posture: Secondary | ICD-10-CM | POA: Diagnosis not present

## 2017-11-12 DIAGNOSIS — R2681 Unsteadiness on feet: Secondary | ICD-10-CM | POA: Diagnosis not present

## 2017-11-12 DIAGNOSIS — M25611 Stiffness of right shoulder, not elsewhere classified: Secondary | ICD-10-CM | POA: Diagnosis not present

## 2017-11-12 DIAGNOSIS — M6281 Muscle weakness (generalized): Secondary | ICD-10-CM | POA: Diagnosis not present

## 2017-11-19 DIAGNOSIS — R5383 Other fatigue: Secondary | ICD-10-CM | POA: Diagnosis not present

## 2017-11-19 DIAGNOSIS — M25612 Stiffness of left shoulder, not elsewhere classified: Secondary | ICD-10-CM | POA: Diagnosis not present

## 2017-11-19 DIAGNOSIS — R2681 Unsteadiness on feet: Secondary | ICD-10-CM | POA: Diagnosis not present

## 2017-11-19 DIAGNOSIS — M25611 Stiffness of right shoulder, not elsewhere classified: Secondary | ICD-10-CM | POA: Diagnosis not present

## 2017-11-19 DIAGNOSIS — R2689 Other abnormalities of gait and mobility: Secondary | ICD-10-CM | POA: Diagnosis not present

## 2017-11-19 DIAGNOSIS — C50411 Malignant neoplasm of upper-outer quadrant of right female breast: Secondary | ICD-10-CM | POA: Diagnosis not present

## 2017-11-19 DIAGNOSIS — M6281 Muscle weakness (generalized): Secondary | ICD-10-CM | POA: Diagnosis not present

## 2017-11-19 DIAGNOSIS — R293 Abnormal posture: Secondary | ICD-10-CM | POA: Diagnosis not present

## 2017-12-25 DIAGNOSIS — R69 Illness, unspecified: Secondary | ICD-10-CM | POA: Diagnosis not present

## 2017-12-25 DIAGNOSIS — C50411 Malignant neoplasm of upper-outer quadrant of right female breast: Secondary | ICD-10-CM | POA: Diagnosis not present

## 2017-12-25 DIAGNOSIS — Z8571 Personal history of Hodgkin lymphoma: Secondary | ICD-10-CM | POA: Diagnosis not present

## 2017-12-25 DIAGNOSIS — Z853 Personal history of malignant neoplasm of breast: Secondary | ICD-10-CM | POA: Diagnosis not present

## 2017-12-25 DIAGNOSIS — E1169 Type 2 diabetes mellitus with other specified complication: Secondary | ICD-10-CM | POA: Diagnosis not present

## 2017-12-25 DIAGNOSIS — M8589 Other specified disorders of bone density and structure, multiple sites: Secondary | ICD-10-CM | POA: Diagnosis not present

## 2018-03-10 DIAGNOSIS — R69 Illness, unspecified: Secondary | ICD-10-CM | POA: Diagnosis not present

## 2018-06-03 DIAGNOSIS — Z923 Personal history of irradiation: Secondary | ICD-10-CM | POA: Diagnosis not present

## 2018-06-03 DIAGNOSIS — Z9221 Personal history of antineoplastic chemotherapy: Secondary | ICD-10-CM | POA: Diagnosis not present

## 2018-06-03 DIAGNOSIS — Z9484 Stem cells transplant status: Secondary | ICD-10-CM | POA: Diagnosis not present

## 2018-06-03 DIAGNOSIS — Z853 Personal history of malignant neoplasm of breast: Secondary | ICD-10-CM | POA: Diagnosis not present

## 2018-06-03 DIAGNOSIS — J449 Chronic obstructive pulmonary disease, unspecified: Secondary | ICD-10-CM | POA: Diagnosis not present

## 2018-06-03 DIAGNOSIS — R69 Illness, unspecified: Secondary | ICD-10-CM | POA: Diagnosis not present

## 2018-06-03 DIAGNOSIS — M170 Bilateral primary osteoarthritis of knee: Secondary | ICD-10-CM

## 2018-06-03 DIAGNOSIS — Z8571 Personal history of Hodgkin lymphoma: Secondary | ICD-10-CM | POA: Diagnosis not present

## 2018-06-03 DIAGNOSIS — C50911 Malignant neoplasm of unspecified site of right female breast: Secondary | ICD-10-CM | POA: Diagnosis not present

## 2018-06-03 DIAGNOSIS — F17210 Nicotine dependence, cigarettes, uncomplicated: Secondary | ICD-10-CM

## 2018-06-03 DIAGNOSIS — M8589 Other specified disorders of bone density and structure, multiple sites: Secondary | ICD-10-CM | POA: Diagnosis not present

## 2018-06-03 DIAGNOSIS — Z170 Estrogen receptor positive status [ER+]: Secondary | ICD-10-CM | POA: Diagnosis not present

## 2018-06-03 DIAGNOSIS — M8580 Other specified disorders of bone density and structure, unspecified site: Secondary | ICD-10-CM | POA: Diagnosis not present

## 2018-06-03 DIAGNOSIS — E1165 Type 2 diabetes mellitus with hyperglycemia: Secondary | ICD-10-CM | POA: Diagnosis not present

## 2018-06-15 DIAGNOSIS — R609 Edema, unspecified: Secondary | ICD-10-CM | POA: Diagnosis not present

## 2018-06-25 DIAGNOSIS — M25511 Pain in right shoulder: Secondary | ICD-10-CM | POA: Diagnosis not present

## 2018-06-26 DIAGNOSIS — R69 Illness, unspecified: Secondary | ICD-10-CM | POA: Diagnosis not present

## 2018-06-26 DIAGNOSIS — Z794 Long term (current) use of insulin: Secondary | ICD-10-CM | POA: Diagnosis not present

## 2018-06-26 DIAGNOSIS — J449 Chronic obstructive pulmonary disease, unspecified: Secondary | ICD-10-CM | POA: Diagnosis not present

## 2018-06-26 DIAGNOSIS — T17590A Other foreign object in bronchus causing asphyxiation, initial encounter: Secondary | ICD-10-CM | POA: Diagnosis not present

## 2018-06-26 DIAGNOSIS — Z79899 Other long term (current) drug therapy: Secondary | ICD-10-CM | POA: Diagnosis not present

## 2018-06-26 DIAGNOSIS — I10 Essential (primary) hypertension: Secondary | ICD-10-CM | POA: Diagnosis not present

## 2018-06-26 DIAGNOSIS — J9809 Other diseases of bronchus, not elsewhere classified: Secondary | ICD-10-CM | POA: Diagnosis not present

## 2018-06-26 DIAGNOSIS — I499 Cardiac arrhythmia, unspecified: Secondary | ICD-10-CM | POA: Diagnosis not present

## 2018-06-26 DIAGNOSIS — J90 Pleural effusion, not elsewhere classified: Secondary | ICD-10-CM | POA: Diagnosis not present

## 2018-06-26 DIAGNOSIS — R05 Cough: Secondary | ICD-10-CM | POA: Diagnosis not present

## 2018-06-26 DIAGNOSIS — X58XXXA Exposure to other specified factors, initial encounter: Secondary | ICD-10-CM | POA: Diagnosis not present

## 2018-07-16 DIAGNOSIS — Z6824 Body mass index (BMI) 24.0-24.9, adult: Secondary | ICD-10-CM | POA: Diagnosis not present

## 2018-07-16 DIAGNOSIS — M25471 Effusion, right ankle: Secondary | ICD-10-CM | POA: Diagnosis not present

## 2018-07-16 DIAGNOSIS — J449 Chronic obstructive pulmonary disease, unspecified: Secondary | ICD-10-CM | POA: Diagnosis not present

## 2018-07-16 DIAGNOSIS — Z79899 Other long term (current) drug therapy: Secondary | ICD-10-CM | POA: Diagnosis not present

## 2018-07-16 DIAGNOSIS — R0600 Dyspnea, unspecified: Secondary | ICD-10-CM | POA: Diagnosis not present

## 2018-07-16 DIAGNOSIS — E119 Type 2 diabetes mellitus without complications: Secondary | ICD-10-CM | POA: Diagnosis not present

## 2018-07-16 DIAGNOSIS — J069 Acute upper respiratory infection, unspecified: Secondary | ICD-10-CM | POA: Diagnosis not present

## 2018-07-30 DIAGNOSIS — R69 Illness, unspecified: Secondary | ICD-10-CM | POA: Diagnosis not present

## 2018-07-30 DIAGNOSIS — E1069 Type 1 diabetes mellitus with other specified complication: Secondary | ICD-10-CM | POA: Diagnosis not present

## 2018-07-30 DIAGNOSIS — M25471 Effusion, right ankle: Secondary | ICD-10-CM | POA: Diagnosis not present

## 2018-07-30 DIAGNOSIS — R0600 Dyspnea, unspecified: Secondary | ICD-10-CM | POA: Diagnosis not present

## 2018-07-30 DIAGNOSIS — Z1331 Encounter for screening for depression: Secondary | ICD-10-CM | POA: Diagnosis not present

## 2018-07-30 DIAGNOSIS — J449 Chronic obstructive pulmonary disease, unspecified: Secondary | ICD-10-CM | POA: Diagnosis not present

## 2018-07-30 DIAGNOSIS — Z6825 Body mass index (BMI) 25.0-25.9, adult: Secondary | ICD-10-CM | POA: Diagnosis not present

## 2018-07-30 DIAGNOSIS — E1065 Type 1 diabetes mellitus with hyperglycemia: Secondary | ICD-10-CM | POA: Diagnosis not present

## 2018-08-06 DIAGNOSIS — J449 Chronic obstructive pulmonary disease, unspecified: Secondary | ICD-10-CM | POA: Diagnosis not present

## 2018-08-06 DIAGNOSIS — I361 Nonrheumatic tricuspid (valve) insufficiency: Secondary | ICD-10-CM | POA: Diagnosis not present

## 2018-08-06 DIAGNOSIS — E1065 Type 1 diabetes mellitus with hyperglycemia: Secondary | ICD-10-CM | POA: Diagnosis not present

## 2018-08-06 DIAGNOSIS — I340 Nonrheumatic mitral (valve) insufficiency: Secondary | ICD-10-CM | POA: Diagnosis not present

## 2018-08-06 DIAGNOSIS — R0600 Dyspnea, unspecified: Secondary | ICD-10-CM | POA: Diagnosis not present

## 2018-08-06 DIAGNOSIS — I5020 Unspecified systolic (congestive) heart failure: Secondary | ICD-10-CM | POA: Diagnosis not present

## 2018-08-06 DIAGNOSIS — Z6826 Body mass index (BMI) 26.0-26.9, adult: Secondary | ICD-10-CM | POA: Diagnosis not present

## 2018-08-06 DIAGNOSIS — M25471 Effusion, right ankle: Secondary | ICD-10-CM | POA: Diagnosis not present

## 2018-08-06 DIAGNOSIS — I081 Rheumatic disorders of both mitral and tricuspid valves: Secondary | ICD-10-CM | POA: Diagnosis not present

## 2018-08-06 DIAGNOSIS — I313 Pericardial effusion (noninflammatory): Secondary | ICD-10-CM | POA: Diagnosis not present

## 2018-08-07 ENCOUNTER — Encounter: Payer: Self-pay | Admitting: Cardiology

## 2018-08-07 ENCOUNTER — Other Ambulatory Visit: Payer: Self-pay

## 2018-08-07 ENCOUNTER — Ambulatory Visit (INDEPENDENT_AMBULATORY_CARE_PROVIDER_SITE_OTHER): Payer: Medicare HMO | Admitting: Cardiology

## 2018-08-07 VITALS — BP 94/78 | HR 96 | Ht 64.5 in | Wt 151.2 lb

## 2018-08-07 DIAGNOSIS — T451X5A Adverse effect of antineoplastic and immunosuppressive drugs, initial encounter: Secondary | ICD-10-CM

## 2018-08-07 DIAGNOSIS — E119 Type 2 diabetes mellitus without complications: Secondary | ICD-10-CM

## 2018-08-07 DIAGNOSIS — I420 Dilated cardiomyopathy: Secondary | ICD-10-CM | POA: Diagnosis not present

## 2018-08-07 DIAGNOSIS — M7989 Other specified soft tissue disorders: Secondary | ICD-10-CM | POA: Diagnosis not present

## 2018-08-07 DIAGNOSIS — J449 Chronic obstructive pulmonary disease, unspecified: Secondary | ICD-10-CM

## 2018-08-07 DIAGNOSIS — I427 Cardiomyopathy due to drug and external agent: Secondary | ICD-10-CM

## 2018-08-07 DIAGNOSIS — R0602 Shortness of breath: Secondary | ICD-10-CM

## 2018-08-07 DIAGNOSIS — E785 Hyperlipidemia, unspecified: Secondary | ICD-10-CM

## 2018-08-07 DIAGNOSIS — Z794 Long term (current) use of insulin: Secondary | ICD-10-CM | POA: Diagnosis not present

## 2018-08-07 DIAGNOSIS — E1169 Type 2 diabetes mellitus with other specified complication: Secondary | ICD-10-CM | POA: Diagnosis not present

## 2018-08-07 MED ORDER — TORSEMIDE 20 MG PO TABS: 20.0000 mg | ORAL_TABLET | Freq: Every day | ORAL | 1 refills | Status: DC

## 2018-08-07 NOTE — Patient Instructions (Addendum)
Medication Instructions:  Your physician has recommended you make the following change in your medication:   START torsemide (demadex) 20 mg: Take 1 tablet daily   If you need a refill on your cardiac medications before your next appointment, please call your pharmacy.   Lab work: Your physician recommends that you return for lab work today: BMP, ProBNP.   If you have labs (blood work) drawn today and your tests are completely normal, you will receive your results only by: Marland Kitchen MyChart Message (if you have MyChart) OR . A paper copy in the mail If you have any lab test that is abnormal or we need to change your treatment, we will call you to review the results.  Testing/Procedures: You had an EKG today.   Follow-Up: At [facility], you and your health needs are our priority.  As part of our continuing mission to provide you with exceptional heart care, we have created designated Provider Care Teams.  These Care Teams include your primary Cardiologist (physician) and Advanced Practice Providers (APPs -  Physician Assistants and Nurse Practitioners) who all work together to provide you with the care you need, when you need it. You will need a follow up appointment in 1 weeks.        Heart Failure  Weigh yourself every morning when you first wake up and record on a calender or note pad, bring this to your office visits. Using a pill tender can help with taking your medications consistently.  Limit your fluid intake to 2 liters daily  Limit your sodium intake to less than 2-3 grams daily. Ask if you need dietary teaching.  If you gain more than 3 pounds (from your dry weight ), double your dose of diuretic for the day.  If you gain more than 5 pounds (from your dry weight), double your dose of lasix and call your heart failure doctor.  Please do not smoke tobacco since it is very bad for your heart.  Please do not drink alcohol since it can worsen your heart failure.Also avoid OTC  nonsteroidal drugs, such as advil, aleve and motrin.  Try to exercise for at least 30 minutes every day because this will help your heart be more efficient. You may be eligible for superv   Torsemide tablets What is this medicine? TORSEMIDE (TORE se mide) is a diuretic. It helps you make more urine and to lose salt and excess water from your body. This medicine is used to treat high blood pressure, and edema or swelling from heart, kidney, or liver disease. This medicine may be used for other purposes; ask your health care provider or pharmacist if you have questions. COMMON BRAND NAME(S): Demadex What should I tell my health care provider before I take this medicine? They need to know if you have any of these conditions: -abnormal blood electrolytes -diabetes -gout -heart disease -kidney disease -liver disease -small amounts of urine, or difficulty passing urine -an unusual or allergic reaction to torsemide, sulfa drugs, other medicines, foods, dyes, or preservatives -pregnant or trying to get pregnant -breast-feeding How should I use this medicine? Take this medicine by mouth with a glass of water. Follow the directions on the prescription label. You may take this medicine with or without food. If it upsets your stomach, take it with food or milk. Do not take your medicine more often than directed. Remember that you will need to pass more urine after taking this medicine. Do not take your medicine at a  time of day that will cause you problems. Do not take at bedtime. Talk to your pediatrician regarding the use of this medicine in children. Special care may be needed. Overdosage: If you think you have taken too much of this medicine contact a poison control center or emergency room at once. NOTE: This medicine is only for you. Do not share this medicine with others. What if I miss a dose? If you miss a dose, take it as soon as you can. If it is almost time for your next dose, take only  that dose. Do not take double or extra doses. What may interact with this medicine? -alcohol -certain antibiotics given by injection certain heart medicines like digoxin -diuretics -lithium -medicines for diabetes -medicines for blood pressure -medicines for cholesterol like cholestyramine -medicines that relax muscles for surgery -NSAIDs, medicines for pain and inflammation, like ibuprofen or naproxen -OTC supplements like ginseng and ephedra -probenecid -steroid medicines like prednisone or cortisone This list may not describe all possible interactions. Give your health care provider a list of all the medicines, herbs, non-prescription drugs, or dietary supplements you use. Also tell them if you smoke, drink alcohol, or use illegal drugs. Some items may interact with your medicine. What should I watch for while using this medicine? Visit your doctor or health care professional for regular checks on your progress. Check your blood pressure regularly. Ask your doctor or health care professional what your blood pressure should be, and when you should contact him or her. If you are a diabetic, check your blood sugar as directed. You may need to be on a special diet while taking this medicine. Check with your doctor. Also, ask how many glasses of fluid you need to drink a day. You must not get dehydrated. You may get drowsy or dizzy. Do not drive, use machinery, or do anything that needs mental alertness until you know how this drug affects you. Do not stand or sit up quickly, especially if you are an older patient. This reduces the risk of dizzy or fainting spells. Alcohol can make you more drowsy and dizzy. Avoid alcoholic drinks. What side effects may I notice from receiving this medicine? Side effects that you should report to your doctor or health care professional as soon as possible: -allergic reactions such as skin rash or itching, hives, swelling of the lips, mouth, tongue or  throat -blood in urine or stool -dry mouth -hearing loss or ringing in the ears -irregular heartbeat -muscle pain, weakness or cramps -pain or difficulty passing urine -unusually weak or tired -vomiting or diarrhea Side effects that usually do not require medical attention (report to your doctor or health care professional if they continue or are bothersome): -dizzy or lightheaded -headache -increased thirst -passing large amounts of urine -sexual difficulties -stomach pain, upset or nausea This list may not describe all possible side effects. Call your doctor for medical advice about side effects. You may report side effects to FDA at 1-800-FDA-1088. Where should I keep my medicine? Keep out of the reach of children. Store at room temperature between 15 and 30 degrees C (59 and 86 degrees F). Throw away any unused medicine after the expiration date. NOTE: This sheet is a summary. It may not cover all possible information. If you have questions about this medicine, talk to your doctor, pharmacist, or health care provider.  2019 Elsevier/Gold Standard (2008-01-28 11:35:45) ised cardiac rehab, ask your physician.

## 2018-08-07 NOTE — Progress Notes (Signed)
Cardiology Office Note:    Date:  08/07/2018   ID:  Ann Gilmore, DOB 1956-02-07, MRN [id number]  PCP:  Janine Limbo, PA-C  Cardiologist:  Shirlee More, MD   Referring MD: Janine Limbo, PA-C  ASSESSMENT:    1. Chemotherapy induced cardiomyopathy ([place])   2. Dilated cardiomyopathy ([place])   3. Chronic obstructive pulmonary disease, unspecified COPD type ([place])   4. Type 2 diabetes mellitus without complication, with long-term current use of insulin ([place])   5. Dyslipidemia associated with type 2 diabetes mellitus ([facility])   6. Leg swelling   7. Shortness of breath    PLAN:    In order of problems listed above:  1. He has a severe cardiomyopathy related to chemotherapy radiation initiated diuretic and hopefully be able to tolerate standard therapy such as Entresto beta-blocker on top of her MRA.  We will initiate discussion the potential need for an ICD depending on follow-up EF and response to drug therapy 2. Stable COPD continue bronchodilator 3. Stable diabetes 4. Heart failure decompensated with anasarca start a loop diuretic sodium restriction  Next appointment 1 week   Medication Adjustments/Labs and Tests Ordered: Current medicines are reviewed at length with the patient today.  Concerns regarding medicines are outlined above.  Orders Placed This Encounter  Procedures  . Basic Metabolic Panel (BMET)  . Pro b natriuretic peptide (BNP)  . EKG 12-Lead   Meds ordered this encounter  Medications  . torsemide (DEMADEX) 20 MG tablet    Sig: Take 1 tablet (20 mg total) by mouth daily.    Dispense:  30 tablet    Refill:  1     Chief Complaint  Patient presents with  . Cardiomyopathy     with edema and severely decreased EF %    History of Present Illness:    Ann Gilmore is a 63 y.o. female who is being seen today for the evaluation of cardiomyopathy at the request of [place], PA-C. She was seen in the office by prior primary care physician for edema  echocardiogram was ordered at [facility].  It was performed 08/06/2018 showed the left ventricle to be dilated severe LV dysfunction global EF estimated at less than 20% with mild mitral mild to moderate tricuspid regurgitation she had a small generalized pericardial effusion without evidence of tamponade.  Past history is noteworthy for breast cancer COPD diabetes and dyslipidemia.  Her medications include Spironolactone and insulin albuterol and bronchodilator Symbicort.  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  She recurred in 2004 and that time was treated again with chemotherapy followed by stem cell transplantation.  Subsequently had stage IIa left breast cancer 2009 received a left mastectomy with previous mantle field radiation and received chemotherapy with Taxotere and Cytoxan for 6 cycles.  In summary she is received both radiation as as well as anthracycline chemotherapy.  Laboratory tests are 108 20 showed normal CBC and CMP she is felt to have no recurrence of other lymphoma or breast cancer.Right breast cancer with surgery 2 yrs ago.  For several months she has not felt well she has gained 30 pounds edema up to her abdomen but also has developed shortness of breath has to rest during ADLs breathless. is having orthopnea and PND.  She has a history of chest radiation as well as anthracycline chemotherapy.  She is in overt decompensated heart failure today blood pressure is  low and will start a low dose of loop diuretic and reassess regarding vasodilator beta-blocker next visit.  I will ask her to begin sodium restriction daily weights and home blood pressure measurement.  She understands the severity of her cardiomyopathy and the implications and even the potential need for an ICD she has had no syncope or chest pain.  Past Medical History:  Diagnosis Date  . Breast cancer ([place])   .  Cancer ([place])   . COPD (chronic obstructive pulmonary disease) ([place])   . Diabetes mellitus without complication ([place])   . Hodgkin lymphoma ([place])   . Hyperlipidemia     Past Surgical History:  Procedure Laterality Date  . CARPAL TUNNEL RELEASE    . LIMBAL STEM CELL TRANSPLANT    . MASTECTOMY Bilateral   . OVARIAN CYST SURGERY    . PORTA CATH INSERTION      Current Medications: Current Meds  Medication Sig  . albuterol (PROVENTIL HFA;VENTOLIN HFA) 108 (90 Base) MCG/ACT inhaler INHALE 1 TO 2 PUFFS BY MOUTH EVERY 4 TO 6 HOURS AS NEEDED  . spironolactone (ALDACTONE) 25 MG tablet Take 25 mg by mouth daily.  . SYMBICORT 160-4.5 MCG/ACT inhaler Inhale 1 puff into the lungs 2 (two) times daily.  Tyler Aas FLEXTOUCH 100 UNIT/ML SOPN FlexTouch Pen Inject 24 Units into the skin daily.     Allergies:   Oxycodone-acetaminophen; Aspartame and phenylalanine; and Fire ant   Social History   Socioeconomic History  . Marital status: Married    Spouse name: Not on file  . Number of children: Not on file  . Years of education: Not on file  . Highest education level: Not on file  Occupational History  . Not on file  Social Needs  . Financial resource strain: Not on file  . Food insecurity:    Worry: Not on file    Inability: Not on file  . Transportation needs:    Medical: Not on file    Non-medical: Not on file  Tobacco Use  . Smoking status: Current Every Day Smoker    Packs/day: 0.25    Years: 40.00    Pack years: 10.00    Types: Cigarettes  . Smokeless tobacco: Never Used  Substance and Sexual Activity  . Alcohol use: Yes    Comment: occ  . Drug use: Not Currently  . Sexual activity: Not on file  Lifestyle  . Physical activity:    Days per week: Not on file    Minutes per session: Not on file  . Stress: Not on file  Relationships  . Social connections:    Talks on phone: Not on file    Gets together: Not on file    Attends religious service: Not on file    Active  member of club or organization: Not on file    Attends meetings of clubs or organizations: Not on file    Relationship status: Not on file  Other Topics Concern  . Not on file  Social History Narrative  . Not on file     Family History: The patient's family history includes Diabetes in her father; Heart attack in her father; Hyperlipidemia in her mother; Hypertension in her mother.  ROS:   Review of Systems  Constitution: Positive for malaise/fatigue and weight gain (30 lbs).  HENT: Negative.   Eyes: Negative.   Cardiovascular: Positive for dyspnea on exertion, leg swelling and orthopnea.  Respiratory: Positive for shortness of breath.   Endocrine: Negative.  Hematologic/Lymphatic: Negative.   Skin: Negative.   Musculoskeletal: Positive for back pain and joint pain.  Gastrointestinal: Negative.   Genitourinary: Negative.   Neurological: Negative.   Psychiatric/Behavioral: Negative.   Allergic/Immunologic: Negative.    Please see the history of present illness.     All other systems reviewed and are negative.  EKGs/Labs/Other Studies Reviewed:    The following studies were reviewed today:   EKG:  EKG is  ordered today.  The ekg ordered today is personally reviewed and demonstrates sinus tachycardia right axis deviation old anterior septal MI T wave inversion  Recent Labs: No results found for requested labs within last 8760 hours.  Recent Lipid Panel No results found for: CHOL, TRIG, HDL, CHOLHDL, VLDL, LDLCALC, LDLDIRECT  Physical Exam:    VS:  BP 94/78 (BP Location: Left Arm, Patient Position: Sitting, Cuff Size: Small)   Pulse 96   Ht 5' 4.5" (1.638 m)   Wt 151 lb 3.2 oz (68.6 kg)   SpO2 94%   BMI 25.55 kg/m     Wt Readings from Last 3 Encounters:  08/07/18 151 lb 3.2 oz (68.6 kg)     GEN: She has a sallow appearance she looks very chronically ill and debilitated and frail  in no acute distress HEENT: Normal NECK: Moderate JVD; No carotid bruits there is  no Kussmaul sign LYMPHATICS: No lymphadenopathy CARDIAC: Paradoxical S2 loud S3 RRR, no murmurs, rubs, gallops RESPIRATORY: Diminished breath sounds diffusely without rales, wheezing or rhonchi  ABDOMEN: Soft, non-tender, non-distended MUSCULOSKELETAL: Anasarca greater than 4+ edema above the umbilicus edema; No deformity  SKIN: Warm and dry NEUROLOGIC:  Alert and oriented x 3 PSYCHIATRIC:  Normal affect     Signed, Shirlee More, MD  08/07/2018 3:29 PM     [facility]

## 2018-08-08 LAB — BASIC METABOLIC PANEL
BUN/Creatinine Ratio: 21 (ref 12–28)
BUN: 13 mg/dL (ref 8–27)
CO2: 27 mmol/L (ref 20–29)
Calcium: 9.6 mg/dL (ref 8.7–10.3)
Chloride: 97 mmol/L (ref 96–106)
Creatinine, Ser: 0.63 mg/dL (ref 0.57–1.00)
GFR calc Af Amer: 113 mL/min/{1.73_m2} (ref >59)
GFR calc non Af Amer: 98 mL/min/{1.73_m2} (ref >59)
Glucose: 99 mg/dL (ref 65–99)
Potassium: 4.5 mmol/L (ref 3.5–5.2)
Sodium: 142 mmol/L (ref 134–144)

## 2018-08-08 LAB — PRO B NATRIURETIC PEPTIDE: NT-Pro BNP: 1766 pg/mL — ABNORMAL HIGH (ref 0–287)

## 2018-08-17 ENCOUNTER — Telehealth (INDEPENDENT_AMBULATORY_CARE_PROVIDER_SITE_OTHER): Payer: Medicare HMO | Admitting: Cardiology

## 2018-08-17 ENCOUNTER — Encounter: Payer: Self-pay | Admitting: Cardiology

## 2018-08-17 ENCOUNTER — Other Ambulatory Visit: Payer: Self-pay

## 2018-08-17 ENCOUNTER — Ambulatory Visit: Payer: Medicare HMO | Admitting: Cardiology

## 2018-08-17 VITALS — BP 89/68 | HR 68 | Wt 137.0 lb

## 2018-08-17 DIAGNOSIS — R0602 Shortness of breath: Secondary | ICD-10-CM | POA: Diagnosis not present

## 2018-08-17 DIAGNOSIS — I420 Dilated cardiomyopathy: Secondary | ICD-10-CM | POA: Diagnosis not present

## 2018-08-17 NOTE — Patient Instructions (Addendum)
Medication Instructions:  Your physician has recommended you make the following change in your medication:   INCREASE torsemide (demadex) 20 mg: Take 1 tablet twice daily  If you need a refill on your cardiac medications before your next appointment, please call your pharmacy.   Lab work: Your physician recommends that you return for lab work on Wednesday, 08/19/2018: BMP, ProBNP.   If you have labs (blood work) drawn today and your tests are completely normal, you will receive your results only by: Marland Kitchen MyChart Message (if you have MyChart) OR . A paper copy in the mail If you have any lab test that is abnormal or we need to change your treatment, we will call you to review the results.  Testing/Procedures: None  Follow-Up: At [facility], you and your health needs are our priority.  As part of our continuing mission to provide you with exceptional heart care, we have created designated Provider Care Teams.  These Care Teams include your primary Cardiologist (physician) and Advanced Practice Providers (APPs -  Physician Assistants and Nurse Practitioners) who all work together to provide you with the care you need, when you need it. You will need a follow up televisit in 2 weeks: Wednesday, 09/02/2018 at 11:00 am.

## 2018-08-17 NOTE — Progress Notes (Signed)
Telephone Visit     Evaluation Performed:  Follow-up visit  This visit type was conducted due to national recommendations for restrictions regarding the COVID-19 Pandemic (e.g. social distancing).  This format is felt to be most appropriate for this patient at this time.  All issues noted in this document were discussed and addressed.  No physical exam was performed (except for noted visual exam findings with Telehealth visits).  See MyChart message from today for the patient's consent to telehealth for [facility].  Date:  08/17/2018   ID:  Ann Gilmore, DOB March 12, 1956, MRN [id number]  Patient Location:  [address] [place] [address]   Provider location:   [facility]  PCP:  Janine Limbo, PA-C  Cardiologist:  No primary care provider on file. Dr Bettina Gavia Electrophysiologist:  None   Chief Complaint: Heart failure  History of Present Illness:    Ann Gilmore is a 63 y.o. female who presents via audio/video conferencing for a telehealth visit today.  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  She was placed on loop and distal diuretic and is lost over 15 pounds in the last week her weight is actually gone up 2 pounds.  She still feels weak and fatigued has a mild degree of peripheral edema and no longer has orthopnea PND has had no chest pain palpitation or syncope.  Her home blood pressure still runs in the 90-100 range.  Fortunately she does sodium restrict and weighs daily.  For safety will check labs on her including BMP proBNP increase her torsemide to twice daily and plan to follow-up again in telehealth in a few weeks.  Any questions or if her weight falls more than 3 pounds per day she will call our office and go back to once daily torsemide.  At this time being managed at a distance and with hypotension I will not start her on vasodilator beta-blocker until after the next interaction  The patient does not symptoms concerning for COVID-19  infection (fever, chills, cough, or new SHORTNESS OF BREATH).    Prior CV studies:   The following studies were reviewed today:    Past Medical History:  Diagnosis Date  . Breast cancer ([place])   . Cancer ([place])   . COPD (chronic obstructive pulmonary disease) ([place])   . Diabetes mellitus without complication ([place])   . Hodgkin lymphoma ([place])   . Hyperlipidemia    Past Surgical History:  Procedure Laterality Date  . CARPAL TUNNEL RELEASE    . LIMBAL STEM CELL TRANSPLANT    . MASTECTOMY Bilateral   . OVARIAN CYST SURGERY    . PORTA CATH INSERTION       Current Meds  Medication Sig  . albuterol (PROVENTIL HFA;VENTOLIN HFA) 108 (90 Base) MCG/ACT inhaler INHALE 1 TO 2 PUFFS BY MOUTH EVERY 4 TO 6 HOURS AS NEEDED  . spironolactone (ALDACTONE) 25 MG tablet Take 25 mg by mouth daily.  . SYMBICORT 160-4.5 MCG/ACT inhaler Inhale 1 puff into the lungs 2 (two) times daily.  Marland Kitchen torsemide (DEMADEX) 20 MG tablet Take 1 tablet (20 mg total) by mouth daily.  Tyler Aas FLEXTOUCH 100 UNIT/ML SOPN FlexTouch Pen Inject 24 Units into the skin daily.     Allergies:   Oxycodone-acetaminophen; Aspartame and phenylalanine; and Fire ant   Social History   Tobacco Use  . Smoking status: Current Every Day Smoker    Packs/day: 0.25    Years: 40.00    Pack years: 10.00  Types: Cigarettes  . Smokeless tobacco: Never Used  Substance Use Topics  . Alcohol use: Yes    Comment: occ  . Drug use: Not Currently     Family Hx: The patient's family history includes Diabetes in her father; Heart attack in her father; Hyperlipidemia in her mother; Hypertension in her mother.  ROS:   Please see the history of present illness.     All other systems reviewed and are negative.   Labs/Other Tests and Data Reviewed:    Recent Labs: 08/07/2018: BUN 13; Creatinine, Ser 0.63; NT-Pro BNP 1,766; Potassium 4.5; Sodium 142   Recent Lipid Panel No results found for: CHOL, TRIG, HDL, CHOLHDL, LDLCALC, LDLDIRECT   Wt Readings from Last 3 Encounters:  08/17/18 137 lb (62.1 kg)  08/07/18 151 lb 3.2 oz (68.6 kg)     Exam:    Vital Signs:  BP (!) 89/68 (BP Location: Left Arm, Patient Position: Sitting, Cuff Size: Normal)   Pulse 68   Wt 137 lb (62.1 kg)   BMI 23.15 kg/m    Home weight blood pressure assessed through the patient and as we discussed she looked at her legs and described to me with a small amount of peripheral edema at the ankles  ASSESSMENT & PLAN:    1.  Heart failure chronic systolic improved increase her Demadex check labs including BMP proBNP and reassess in 3 weeks regarding starting vasodilator therapy with Entresto.  Her blood pressure is relatively low hold at this time at the next visit continue to trend at home.  Fortunately she is very compliant has good healthcare literacy and is capable of self-management.  COVID-19 Education: The signs and symptoms of COVID-19 were discussed with the patient and how to seek care for testing (follow up with PCP or arrange E-visit).  The importance of social distancing was discussed today.  Patient Risk:   After full review of this patients clinical status, I feel that they are at least moderate risk at this time.  Time:   Today, I have spent 20 minutes with the patient with telehealth technology discussing her response to treatment for heart failure ongoing symptomatology education and the need for sodium restriction home self-management compliance of medications and creating a care plan for increased dose of diuretic and deferring vasodilators to her next visit.     Medication Adjustments/Labs and Tests Ordered: Current medicines are reviewed at length with the patient today.  Concerns regarding medicines are outlined above.  Tests Ordered: No orders of the defined types were placed in this encounter.  Medication Changes: No orders of the defined types were placed in this encounter.   Disposition: 3 weeks telehealth  Signed,  Shirlee More, MD  08/17/2018 1:58 PM    [facility] [address], [place], [place]  [address] Phone: [phone]; Fax: [phone]

## 2018-08-19 ENCOUNTER — Other Ambulatory Visit: Payer: Self-pay

## 2018-08-19 DIAGNOSIS — R0602 Shortness of breath: Secondary | ICD-10-CM | POA: Diagnosis not present

## 2018-08-19 DIAGNOSIS — I420 Dilated cardiomyopathy: Secondary | ICD-10-CM | POA: Diagnosis not present

## 2018-08-19 NOTE — Addendum Note (Signed)
Addended by: Tarri Glenn on: 08/19/2018 02:41 PM   Modules accepted: Orders

## 2018-08-20 ENCOUNTER — Other Ambulatory Visit: Payer: Self-pay

## 2018-08-20 LAB — BASIC METABOLIC PANEL
BUN/Creatinine Ratio: 24 (ref 12–28)
BUN: 18 mg/dL (ref 8–27)
CO2: 34 mmol/L — ABNORMAL HIGH (ref 20–29)
Calcium: 9.3 mg/dL (ref 8.7–10.3)
Chloride: 91 mmol/L — ABNORMAL LOW (ref 96–106)
Creatinine, Ser: 0.74 mg/dL (ref 0.57–1.00)
GFR calc Af Amer: 102 mL/min/{1.73_m2} (ref >59)
GFR calc non Af Amer: 88 mL/min/{1.73_m2} (ref >59)
Glucose: 200 mg/dL — ABNORMAL HIGH (ref 65–99)
Potassium: 4.2 mmol/L (ref 3.5–5.2)
Sodium: 141 mmol/L (ref 134–144)

## 2018-08-20 LAB — PRO B NATRIURETIC PEPTIDE: NT-Pro BNP: 2028 pg/mL — ABNORMAL HIGH (ref 0–287)

## 2018-08-20 MED ORDER — TORSEMIDE 20 MG PO TABS: 20.0000 mg | ORAL_TABLET | Freq: Two times a day (BID) | ORAL | 6 refills | Status: DC

## 2018-08-27 ENCOUNTER — Other Ambulatory Visit: Payer: Self-pay

## 2018-08-27 MED ORDER — SPIRONOLACTONE 25 MG PO TABS: 25.0000 mg | ORAL_TABLET | Freq: Every day | ORAL | 1 refills | Status: DC

## 2018-08-27 NOTE — Telephone Encounter (Signed)
TELEPHONE CALL NOTE  Ann Gilmore has been deemed a candidate for a follow-up tele-health visit to limit community exposure during the Covid-19 pandemic. I spoke with the patient via phone to ensure availability of phone/video source, confirm preferred email & phone number, and discuss instructions and expectations.  I reminded Ann Gilmore to be prepared with any vital sign and/or heart rhythm information that could potentially be obtained via home monitoring, at the time of her visit. I reminded Ann Gilmore to expect a phone call at the time of her visit if her visit.  Did the patient verbally acknowledge consent to treatment? YES  Stevan Born, [place] 08/27/2018 4:51 PM   DOWNLOADING THE [place]  - If Apple, go to [employer] and type in WebEx in the search bar. [employer], the blue/green circle. The app is free but as with any other app downloads, their phone may require them to verify saved payment information or Apple password. The patient does NOT have to create an account.  - If Android, ask patient to go to [employer] and type in WebEx in the search bar. [place] [employer], the blue/green circle. The app is free but as with any other app downloads, their phone may require them to verify saved payment information or Android password. The patient does NOT have to create an account.   CONSENT FOR TELE-HEALTH VISIT - PLEASE REVIEW  I hereby voluntarily request, consent and authorize [facility] and its employed or contracted physicians, physician assistants, nurse practitioners or other licensed health care professionals (the Practitioner), to provide me with telemedicine health care services (the Services") as deemed necessary by the treating Practitioner. I acknowledge and consent to receive the Services by the Practitioner via telemedicine. I understand that the telemedicine visit will involve communicating with the  Practitioner through live audiovisual communication technology and the disclosure of certain medical information by electronic transmission. I acknowledge that I have been given the opportunity to request an in-person assessment or other available alternative prior to the telemedicine visit and am voluntarily participating in the telemedicine visit.  I understand that I have the right to withhold or withdraw my consent to the use of telemedicine in the course of my care at any time, without affecting my right to future care or treatment, and that the Practitioner or I may terminate the telemedicine visit at any time. I understand that I have the right to inspect all information obtained and/or recorded in the course of the telemedicine visit and may receive copies of available information for a reasonable fee.  I understand that some of the potential risks of receiving the Services via telemedicine include:   Delay or interruption in medical evaluation due to technological equipment failure or disruption;  Information transmitted may not be sufficient (e.g. poor resolution of images) to allow for appropriate medical decision making by the Practitioner; and/or   In rare instances, security protocols could fail, causing a breach of personal health information.  Furthermore, I acknowledge that it is my responsibility to provide information about my medical history, conditions and care that is complete and accurate to the best of my ability. I acknowledge that Practitioner's advice, recommendations, and/or decision may be based on factors not within their control, such as incomplete or inaccurate data provided by me or distortions of diagnostic images or specimens that may result from electronic transmissions. I understand that the practice of  medicine is not an Chief Strategy Officer and that Practitioner makes no warranties or guarantees regarding treatment outcomes. I acknowledge that I will receive a copy of this  consent concurrently upon execution via email to the email address I last provided but may also request a printed copy by calling the office of [facility].    I understand that my insurance will be billed for this visit.   I have read or had this consent read to me.  I understand the contents of this consent, which adequately explains the benefits and risks of the Services being provided via telemedicine.   I have been provided ample opportunity to ask questions regarding this consent and the Services and have had my questions answered to my satisfaction.  I give my informed consent for the services to be provided through the use of telemedicine in my medical care  By participating in this telemedicine visit I agree to the above.      Cardiac Questionnaire:    Since your last visit or hospitalization:    1. Have you been having new or worsening chest pain? no   2. Have you been having new or worsening shortness of breath? no 3. Have you been having new or worsening leg swelling, wt gain, or increase in abdominal girth (pants fitting more tightly)?no   4. Have you had any passing out spells?no   _____________   COVID-19 Pre-Screening Questions:   Do you currently have a fever? No  Have you recently travelled on a cruise, [place], or to [place], [place], [place], [place], [place], or [place], [place] [employer]) ? No  Have you been in contact with someone that is currently pending confirmation of Covid19 testing or has been confirmed to have the [place] virus?  No  Are you currently experiencing fatigue or cough? No

## 2018-09-02 ENCOUNTER — Telehealth (INDEPENDENT_AMBULATORY_CARE_PROVIDER_SITE_OTHER): Payer: Medicare HMO | Admitting: Cardiology

## 2018-09-02 ENCOUNTER — Encounter: Payer: Self-pay | Admitting: Cardiology

## 2018-09-02 VITALS — BP 103/67 | HR 98 | Ht 64.5 in | Wt 123.0 lb

## 2018-09-02 DIAGNOSIS — J449 Chronic obstructive pulmonary disease, unspecified: Secondary | ICD-10-CM

## 2018-09-02 DIAGNOSIS — F17210 Nicotine dependence, cigarettes, uncomplicated: Secondary | ICD-10-CM | POA: Diagnosis not present

## 2018-09-02 DIAGNOSIS — E1169 Type 2 diabetes mellitus with other specified complication: Secondary | ICD-10-CM

## 2018-09-02 DIAGNOSIS — R69 Illness, unspecified: Secondary | ICD-10-CM | POA: Diagnosis not present

## 2018-09-02 DIAGNOSIS — Z79899 Other long term (current) drug therapy: Secondary | ICD-10-CM | POA: Diagnosis not present

## 2018-09-02 DIAGNOSIS — Z794 Long term (current) use of insulin: Secondary | ICD-10-CM

## 2018-09-02 DIAGNOSIS — E785 Hyperlipidemia, unspecified: Secondary | ICD-10-CM

## 2018-09-02 DIAGNOSIS — E119 Type 2 diabetes mellitus without complications: Secondary | ICD-10-CM

## 2018-09-02 DIAGNOSIS — I5022 Chronic systolic (congestive) heart failure: Secondary | ICD-10-CM

## 2018-09-02 MED ORDER — TORSEMIDE 20 MG PO TABS: 20.0000 mg | ORAL_TABLET | Freq: Every day | ORAL | 6 refills | Status: DC

## 2018-09-02 MED ORDER — SACUBITRIL-VALSARTAN 24-26 MG PO TABS: 1.0000 | ORAL_TABLET | ORAL | 3 refills | Status: DC

## 2018-09-02 NOTE — Patient Instructions (Addendum)
Medication Instructions:  Your physician has recommended you make the following change in your medication:   DECREASE torsemide (demadex) 20 mg: Take 1 tablet daily. If your weight is 125 or greater, take 1 tablet twice daily.   START sacubitril-valsartan (entresto) 24-26 mg: Take 1 tablet every other day  If you need a refill on your cardiac medications before your next appointment, please call your pharmacy.   Lab work: Your physician recommends that you return for lab work during your next office visit on Friday, 09/11/2018: lipid panel, CMP, ProBNP. Please fast beforehand.   If you have labs (blood work) drawn today and your tests are completely normal, you will receive your results only by: Marland Kitchen MyChart Message (if you have MyChart) OR . A paper copy in the mail If you have any lab test that is abnormal or we need to change your treatment, we will call you to review the results.  Testing/Procedures: None  Follow-Up: At [facility], you and your health needs are our priority.  As part of our continuing mission to provide you with exceptional heart care, we have created designated Provider Care Teams.  These Care Teams include your primary Cardiologist (physician) and Advanced Practice Providers (APPs -  Physician Assistants and Nurse Practitioners) who all work together to provide you with the care you need, when you need it. . You will need a follow up appointment in 1 weeks: Friday, 09/11/2018, at 9:20 am in the [place] office.   Any Other Special Instructions Will Be Listed Below (If Applicable).  **Monitor your weight, heart rate, and blood pressure daily at the same time each day and record these readings. Bring this log with you to your next appointment with Dr. Bettina Gavia.   **Call our office if your systolic (top number) blood pressure is less than 90.     Sacubitril; Valsartan oral tablet What is this medicine? SACUBITRIL; VALSARTAN (sak UE bi tril; val SAR tan) is a  combination of 2 drugs used to reduce the risk of death and hospitalizations in people with long-lasting heart failure. It is usually used with other medicines to treat heart failure. This medicine may be used for other purposes; ask your health care provider or pharmacist if you have questions. COMMON BRAND NAME(S): Entresto What should I tell my health care provider before I take this medicine? They need to know if you have any of these conditions: -diabetes and take a medicine that contains aliskiren -kidney disease -liver disease -an unusual or allergic reaction to sacubitril; valsartan, drugs called angiotensin converting enzyme (ACE) inhibitors, angiotensin II receptor blockers (ARBs), other medicines, foods, dyes, or preservatives -pregnant or trying to get pregnant -breast-feeding How should I use this medicine? Take this medicine by mouth with a glass of water. Follow the directions on the prescription label. You can take it with or without food. If it upsets your stomach, take it with food. Take your medicine at regular intervals. Do not take it more often than directed. Do not stop taking except on your doctor's advice. Do not take this medicine for at least 36 hours before or after you take an ACE inhibitor medicine. Talk to your health care provider if you are not sure if you take an ACE inhibitor. Talk to your pediatrician regarding the use of this medicine in children. Special care may be needed. Overdosage: If you think you have taken too much of this medicine contact a poison control center or emergency room at once. NOTE: This medicine  is only for you. Do not share this medicine with others. What if I miss a dose? If you miss a dose, take it as soon as you can. If it is almost time for next dose, take only that dose. Do not take double or extra doses. What may interact with this medicine? Do not take this medicine with any of the following medicines: -aliskiren if you have  diabetes -angiotensin-converting enzyme (ACE) inhibitors, like benazepril, captopril, enalapril, fosinopril, lisinopril, or ramipril This medicine may also interact with the following medicines: -angiotensin II receptor blockers (ARBs) like azilsartan, candesartan, eprosartan, irbesartan, losartan, olmesartan, telmisartan, or valsartan -lithium -NSAIDS, medicines for pain and inflammation, like ibuprofen or naproxen -potassium-sparing diuretics like amiloride, spironolactone, and triamterene -potassium supplements This list may not describe all possible interactions. Give your health care provider a list of all the medicines, herbs, non-prescription drugs, or dietary supplements you use. Also tell them if you smoke, drink alcohol, or use illegal drugs. Some items may interact with your medicine. What should I watch for while using this medicine? Tell your doctor or healthcare professional if your symptoms do not start to get better or if they get worse. Do not become pregnant while taking this medicine. Women should inform their doctor if they wish to become pregnant or think they might be pregnant. There is a potential for serious side effects to an unborn child. Talk to your health care professional or pharmacist for more information. You may get dizzy. Do not drive, use machinery, or do anything that needs mental alertness until you know how this medicine affects you. Do not stand or sit up quickly, especially if you are an older patient. This reduces the risk of dizzy or fainting spells. Avoid alcoholic drinks; they can make you more dizzy. What side effects may I notice from receiving this medicine? Side effects that you should report to your doctor or health care professional as soon as possible: -allergic reactions like skin rash, itching or hives, swelling of the face, lips, or tongue -signs and symptoms of increased potassium like muscle weakness; chest pain; or fast, irregular  heartbeat -signs and symptoms of kidney injury like trouble passing urine or change in the amount of urine -signs and symptoms of low blood pressure like feeling dizzy or lightheaded, or if you develop extreme fatigue Side effects that usually do not require medical attention (report to your doctor or health care professional if they continue or are bothersome): -cough This list may not describe all possible side effects. Call your doctor for medical advice about side effects. You may report side effects to FDA at 1-800-FDA-1088. Where should I keep my medicine? Keep out of the reach of children. Store at room temperature between 15 and 30 degrees C (59 and 86 degrees F). Throw away any unused medicine after the expiration date. NOTE: This sheet is a summary. It may not cover all possible information. If you have questions about this medicine, talk to your doctor, pharmacist, or health care provider.  2019 Elsevier/Gold Standard (2015-06-28 13:54:19)

## 2018-09-02 NOTE — Progress Notes (Signed)
Virtual Visit via Video Note   This visit type was conducted due to national recommendations for restrictions regarding the COVID-19 Pandemic (e.g. social distancing) in an effort to limit this patient's exposure and mitigate transmission in our community.  Due to her co-morbid illnesses, this patient is at least at moderate risk for complications without adequate follow up.  This format is felt to be most appropriate for this patient at this time.  All issues noted in this document were discussed and addressed.  A limited physical exam was performed with this format.  Please refer to the patient's chart for her consent to telehealth for [facility]   Evaluation Performed:  Follow-up visit  Date:  09/02/2018   ID:  Ann Gilmore, DOB July 05, 1955, MRN [id number]  Patient Location: Home  Provider Location: Home  PCP:  Janine Limbo, PA-C  Cardiologist:  No primary care provider on file. dr Bettina Gavia Electrophysiologist:  None   Chief Complaint: Follow-up for congestive heart failure  History of Present Illness:    Ann Gilmore is a 63 y.o. female who presents via audio/video conferencing for a telehealth visit today.    Ann Gilmore is a 63 y.o. female  with a history of  Cardiomyopathy last seen by virtual visit 08/17/2018 .  At that time her heart failure was improved she had had a significant diuresis and with ongoing symptoms and edema her diuretic dosage was increased.  Her blood pressure was relatively low and at that time a decision was made to hold on initiation of treatment Entresto pending follow-up.  Initially she was seen in the office by prior primary care physician for edema echocardiogram was ordered at [facility].  It was performed 08/06/2018 showed the left ventricle to be dilated severe LV dysfunction global EF estimated at less than 20% with mild mitral mild to moderate tricuspid regurgitation she had a small generalized pericardial effusion without evidence of  tamponade.  Past history is noteworthy for breast cancer COPD diabetes and dyslipidemia.  Her medications include Spironolactone and insulin albuterol and bronchodilator Symbicort.   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  She recurred in 2004 and that time was treated again with chemotherapy followed by stem cell transplantation.  Subsequently had stage IIa left breast cancer 2009 received a left mastectomy with previous mantle field radiation and received chemotherapy with Taxotere and Cytoxan for 6 cycles.  In summary she is received both radiation as as well as anthracycline chemotherapy.  The patient does not have symptoms concerning for COVID-19 infection (fever, chills, cough, or new shortness of breath).   She continues to diurese and has plateaued in the range of 123 to 124 pounds.  With this her edema has resolved she is no longer short of breath she no longer sleeps in a chair and has no orthopnea chest pain palpitation or syncope and she tells me that she now feels well and the best she has in a long period of time.  Even her COPD is improved and presently is not wheezing.  We have decided since her home blood pressures are consistently greater than 485 systolic to decrease her diuretic to once daily unless she weighs 125 pounds and to take a second tablet of Demadex in the afternoon and to initiate disease modifying therapy with a minimal dose of Entresto and follow-up in the office 1 week including checking BMP and proBNP  level.  She will continue her other therapy with MRA.  I have not given her a beta-blocker because of her COPD and fragile hemodynamics.  She exhibits good healthcare literacy sodium restricts and meticulously monitors her self.  Her only other complaint is hard stool and I asked her to add fiber to her diet Past Medical History:  Diagnosis Date   Breast cancer ([place])      Cancer ([place])    COPD (chronic obstructive pulmonary disease) (HCC)    Diabetes mellitus without complication ([place])    Hodgkin lymphoma ([place])    Hyperlipidemia    Past Surgical History:  Procedure Laterality Date   CARPAL TUNNEL RELEASE     LIMBAL STEM CELL TRANSPLANT     MASTECTOMY Bilateral    OVARIAN CYST SURGERY     PORTA CATH INSERTION       Current Meds  Medication Sig   albuterol (PROVENTIL HFA;VENTOLIN HFA) 108 (90 Base) MCG/ACT inhaler INHALE 1 TO 2 PUFFS BY MOUTH EVERY 4 TO 6 HOURS AS NEEDED   spironolactone (ALDACTONE) 25 MG tablet Take 1 tablet (25 mg total) by mouth daily.   SYMBICORT 160-4.5 MCG/ACT inhaler Inhale 1 puff into the lungs 2 (two) times daily.   torsemide (DEMADEX) 20 MG tablet Take 1 tablet (20 mg total) by mouth 2 (two) times daily.   TRESIBA FLEXTOUCH 100 UNIT/ML SOPN FlexTouch Pen Inject 29 Units into the skin daily.      Allergies:   Oxycodone-acetaminophen; Aspartame and phenylalanine; and Catering manager   Social History   Tobacco Use   Smoking status: Current Every Day Smoker    Packs/day: 0.25    Years: 40.00    Pack years: 10.00    Types: Cigarettes   Smokeless tobacco: Never Used  Substance Use Topics   Alcohol use: Yes    Comment: occ   Drug use: Not Currently     Family Hx: The patient's family history includes Diabetes in her father; Heart attack in her father; Hyperlipidemia in her mother; Hypertension in her mother.  ROS:   Please see the history of present illness.     All other systems reviewed and are negative.   Prior CV studies:   The following studies were reviewed today:  proBNP level 2 weeks ago remained elevated at 2028 her serum potassium is 4.2 creatinine 0.74 GFR 88 cc/min  Labs/Other Tests and Data Reviewed:    EKG:  An ECG dated 08/07/18 was personally reviewed today and demonstrated:  [facility] RAD NSSTT changes  Recent Labs: 08/19/2018: BUN 18; Creatinine, Ser 0.74; NT-Pro BNP 2,028;  Potassium 4.2; Sodium 141   Recent Lipid Panel No results found for: CHOL, TRIG, HDL, CHOLHDL, LDLCALC, LDLDIRECT  Wt Readings from Last 3 Encounters:  09/02/18 123 lb (55.8 kg)  08/17/18 137 lb (62.1 kg)  08/07/18 151 lb 3.2 oz (68.6 kg)     Objective:    Vital Signs:  BP 103/67 (BP Location: Left Arm, Patient Position: Sitting)    Pulse 98    Ht 5' 4.5" (1.638 m)    Wt 123 lb (55.8 kg)    BMI 20.79 kg/m   Weight decreased total of 28 lbs Constitutional, chronically ill-appearing COPD habitus in no acute distress Vital signs reviewed Eyes, conjunctiva and sclera are normal without pallor or icterus extraocular motions intact and normal there is no lid lag Respiratory, normal effort and excursion no audible wheezing without a stethoscope Cardiovascular, no neck vein distention or peripheral edema Skin, no  rash skin lesion or ulceration of the extremities Neurologic, cranial nerves II to XII are grossly intact and the patient moves all 4 extremities Neuro/Psychiatric, judgment and thought processes are intact and coherent, alert and oriented x3, mood and affect appear normal.  ASSESSMENT & PLAN:    1. Heart failure, chronic systolic she has had a remarkable improvement in all honesty better than I thought we could in the current circumstances she has no volume overload she is presently asymptomatic [place] Heart Association class I we will decrease her maintenance diuretic follow her weights for second dose of Demadex continue MRA and cautiously introduce Entresto with her borderline blood pressures.  Because of concerns of renal insufficiency and hyperkalemia we will recheck labs and do a quick office visit next week.  Hopefully we can uptitrate and then initiate beta-blocker. 2. COPD, stable she has had a remarkable improvement and I think much of what is felt to be COPD was heart failure previously continue her current bronchodilator 3. Next week we will also check a lipid profile she  may require lipid-lowering therapy 4. Stable managed by her PCP  COVID-19 Education: The signs and symptoms of COVID-19 were discussed with the patient and how to seek care for testing (follow up with PCP or arrange E-visit).  The importance of social distancing was discussed today.  Time:   Today, I have spent 30 minutes with the patient with telehealth technology discussing the above problems.     Medication Adjustments/Labs and Tests Ordered: Current medicines are reviewed at length with the patient today.  Concerns regarding medicines are outlined above.  Tests Ordered: No orders of the defined types were placed in this encounter.  Medication Changes: No orders of the defined types were placed in this encounter.   Disposition:  Follow up in 1 week(s)  Signed, Shirlee More, MD  09/02/2018 11:36 AM    [facility]

## 2018-09-04 ENCOUNTER — Telehealth: Payer: Self-pay

## 2018-09-04 NOTE — Telephone Encounter (Signed)
Yes let us give her 60 years and then see her back in the office Monday and I plan to uptitrate her to twice daily every day, her blood pressure is somewhat tenuous she is to have a profound diuresis of close to 30 pounds and I want her to start softly.

## 2018-09-04 NOTE — Telephone Encounter (Signed)
[facility] calling to confirm that Entresto 24-26mg  p.o 1 tab every other day is correct frequency, normally its every day.   pls advise tx!

## 2018-09-04 NOTE — Telephone Encounter (Signed)
[facility] aware to refill Entresto 24-26mg  for #60 per Dr Bettina Gavia, and patient is aware to only take Entresto 24-26mg  one tablet every other day as instructed per Dr Bettina Gavia.  Patient agreed to plan and verbalized understanding.

## 2018-09-07 DIAGNOSIS — R69 Illness, unspecified: Secondary | ICD-10-CM | POA: Diagnosis not present

## 2018-09-11 ENCOUNTER — Ambulatory Visit (INDEPENDENT_AMBULATORY_CARE_PROVIDER_SITE_OTHER): Payer: Medicare HMO

## 2018-09-11 ENCOUNTER — Ambulatory Visit (INDEPENDENT_AMBULATORY_CARE_PROVIDER_SITE_OTHER): Payer: Medicare HMO | Admitting: Cardiology

## 2018-09-11 ENCOUNTER — Other Ambulatory Visit: Payer: Self-pay

## 2018-09-11 ENCOUNTER — Other Ambulatory Visit: Payer: Self-pay | Admitting: Cardiology

## 2018-09-11 ENCOUNTER — Encounter: Payer: Self-pay | Admitting: Cardiology

## 2018-09-11 VITALS — HR 58 | Ht 64.5 in | Wt 116.8 lb

## 2018-09-11 DIAGNOSIS — I499 Cardiac arrhythmia, unspecified: Secondary | ICD-10-CM | POA: Diagnosis not present

## 2018-09-11 DIAGNOSIS — I420 Dilated cardiomyopathy: Secondary | ICD-10-CM

## 2018-09-11 DIAGNOSIS — I5022 Chronic systolic (congestive) heart failure: Secondary | ICD-10-CM

## 2018-09-11 DIAGNOSIS — I427 Cardiomyopathy due to drug and external agent: Secondary | ICD-10-CM | POA: Diagnosis not present

## 2018-09-11 DIAGNOSIS — T451X5A Adverse effect of antineoplastic and immunosuppressive drugs, initial encounter: Secondary | ICD-10-CM

## 2018-09-11 DIAGNOSIS — J449 Chronic obstructive pulmonary disease, unspecified: Secondary | ICD-10-CM

## 2018-09-11 MED ORDER — SACUBITRIL-VALSARTAN 24-26 MG PO TABS: 1.0000 | ORAL_TABLET | Freq: Every day | ORAL | 3 refills | Status: DC

## 2018-09-11 MED ORDER — TORSEMIDE 20 MG PO TABS: 20.0000 mg | ORAL_TABLET | ORAL | 6 refills | Status: DC

## 2018-09-11 NOTE — Progress Notes (Signed)
Cardiology Office Note:    Date:  09/11/2018   ID:  Ann Gilmore, DOB [birth year], MRN [id number]  PCP:  Janine Limbo, PA-C  Cardiologist:  Shirlee More, MD    Referring MD: Janine Limbo, PA-C    ASSESSMENT:    1. 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 Chronic obstructive pulmonary disease, unspecified COPD type ([place])   4. Dilated cardiomyopathy ([place])    PLAN:    In order of problems listed above:  1. Heart failure continues to improve I am concerned her diuretic may be excessive will reduce to every other day and she will take it the day between if her weight hits 125 pounds continue medical therapy with MRA and cautiously increase her Entresto to 1 daily she will call if her systolics are less than 95.  With the severity of her cardiomyopathy long duration of symptoms and context of anthracycline chemotherapy I am concerned that her ejection fraction will not improve we will plan to do an echocardiogram in the middle of May recheck labs today BMP proBNP and apply a 1 week ZIO to screen for arrhythmia.  Likely needs an ICD I initiated the discussion and she is comfortable if it is indicated. 2. Very severe cautiously increase Entresto avoid beta-blockers with her COPD. 3. Stable actually improved I think much of the symptoms attributed COPD with heart failure and she has not needed a rescue inhaler continue Symbicort   Next appointment: 1 month   Medication Adjustments/Labs and Tests Ordered: Current medicines are reviewed at length with the patient today.  Concerns regarding medicines are outlined above.  Orders Placed This Encounter  Procedures  . LONG TERM MONITOR (3-14 DAYS)  . ECHOCARDIOGRAM COMPLETE   No orders of the defined types were placed in this encounter.   Chief Complaint  Patient presents with  . Follow-up  . Cardiomyopathy  . Congestive Heart Failure    History of Present Illness:     Ann Gilmore is a 63 y.o. female  with a history of  a dilated cardiomyopathy with heart failure.  Echocardiogram was performed at [facility] 08/06/2018 showed the left ventricle to be severely dilated global hypokinesia ejection fraction less than 20% with mild mitral and tricuspid moderate regurgitation and a small generalized pericardial effusion.  There is no indication of constrictive pericarditis  She was last seen in virtual visit 09/02/2018 with a marked improvement she had diuresis in the range of 30 pounds and resolution of signs and symptoms of heart failure.  We decreased her maintenance diuretic with weight-based dosage and cautiously introduced Entresto with her borderline blood pressure.  She is brought back to the office for an in person visit today to assure she is hemodynamically stable before increasing dose and recheck lab work including BMP and proBNP with potassium.  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  She recurred in 2004 and that time was treated again with chemotherapy followed by stem cell transplantation.  Subsequently had stage IIa left breast cancer 2009 received a left mastectomy with previous mantle field radiation and received chemotherapy with Taxotere and Cytoxan for 6 cycles.  In summary she is received both radiation as as well as anthracycline chemotherapy.   Compliance with diet, lifestyle and medications: Yes  Last visit we decreased her diuretic dose 50% her weight is unchanged she is down  now a total of 40 pounds and has plateaued in the range of 120 pounds.  She is meticulous in sodium restriction her blood pressure consistently greater than 716 systolic and feels improved with strength and endurance since starting a minimum dose of Entresto.  She took Entresto before coming to our office and by palpation her systolic blood pressure is 92.  She is  not lightheaded she has trace edema no shortness of breath orthopnea palpitation or syncope but she does have cough and irritation from pollen.  She wore mask during the visit.  She has had no fever and no exposure to COVID-19. Past Medical History:  Diagnosis Date  . Breast cancer ([place])   . Cancer ([place])   . COPD (chronic obstructive pulmonary disease) ([place])   . Diabetes mellitus without complication ([place])   . Hodgkin lymphoma ([place])   . Hyperlipidemia     Past Surgical History:  Procedure Laterality Date  . CARPAL TUNNEL RELEASE    . LIMBAL STEM CELL TRANSPLANT    . MASTECTOMY Bilateral   . OVARIAN CYST SURGERY    . PORTA CATH INSERTION      Current Medications: Current Meds  Medication Sig  . albuterol (PROVENTIL HFA;VENTOLIN HFA) 108 (90 Base) MCG/ACT inhaler INHALE 1 TO 2 PUFFS BY MOUTH EVERY 4 TO 6 HOURS AS NEEDED  . sacubitril-valsartan (ENTRESTO) 24-26 MG Take 1 tablet by mouth every other day.  . spironolactone (ALDACTONE) 25 MG tablet Take 1 tablet (25 mg total) by mouth daily.  . SYMBICORT 160-4.5 MCG/ACT inhaler Inhale 1 puff into the lungs 2 (two) times daily.  Marland Kitchen torsemide (DEMADEX) 20 MG tablet Take 1 tablet (20 mg total) by mouth daily. If weight is 125 or greater, take 1 tablet twice daily.  Tyler Aas FLEXTOUCH 100 UNIT/ML SOPN FlexTouch Pen Inject 29 Units into the skin daily.      Allergies:   Oxycodone-acetaminophen; Aspartame and phenylalanine; and Fire ant   Social History   Socioeconomic History  . Marital status: Married    Spouse name: Not on file  . Number of children: Not on file  . Years of education: Not on file  . Highest education level: Not on file  Occupational History  . Not on file  Social Needs  . Financial resource strain: Not on file  . Food insecurity:    Worry: Not on file    Inability: Not on file  . Transportation needs:    Medical: Not on file    Non-medical: Not on file  Tobacco Use  . Smoking status: Current Every Day  Smoker    Packs/day: 0.25    Years: 40.00    Pack years: 10.00    Types: Cigarettes  . Smokeless tobacco: Never Used  Substance and Sexual Activity  . Alcohol use: Yes    Comment: occ  . Drug use: Not Currently  . Sexual activity: Not on file  Lifestyle  . Physical activity:    Days per week: Not on file    Minutes per session: Not on file  . Stress: Not on file  Relationships  . Social connections:    Talks on phone: Not on file    Gets together: Not on file    Attends religious service: Not on file    Active member of club or organization: Not on file    Attends meetings of clubs or organizations: Not on file    Relationship status: Not on file  Other Topics Concern  .  Not on file  Social History Narrative  . Not on file     Family History: The patient's family history includes Diabetes in her father; Heart attack in her father; Hyperlipidemia in her mother; Hypertension in her mother. ROS:   Please see the history of present illness.    All other systems reviewed and are negative.  EKGs/Labs/Other Studies Reviewed:    The following studies were reviewed today:    Recent Labs: 08/19/2018: BUN 18; Creatinine, Ser 0.74; NT-Pro BNP 2,028; Potassium 4.2; Sodium 141  Recent Lipid Panel No results found for: CHOL, TRIG, HDL, CHOLHDL, VLDL, LDLCALC, LDLDIRECT  Physical Exam:    VS:  Pulse (!) 58   Ht 5' 4.5" (1.638 m)   Wt 116 lb 12.8 oz (53 kg)   SpO2 (!) 89%   BMI 19.74 kg/m     Wt Readings from Last 3 Encounters:  09/11/18 116 lb 12.8 oz (53 kg)  09/02/18 123 lb (55.8 kg)  08/17/18 137 lb (62.1 kg)     GEN: She looks less acutely ill and still appears frail chronically ill and her BMI is below normal in no acute distress HEENT: Normal NECK: No JVD; No carotid bruits LYMPHATICS: No lymphadenopathy CARDIAC: Soft S1 no S3 no murmur RRR, no murmurs, rubs, gallops RESPIRATORY:  Clear to auscultation without rales, wheezing or rhonchi  ABDOMEN: Soft,  non-tender, non-distended MUSCULOSKELETAL: Trace bilateral at and just below the ankle edema; No deformity  SKIN: Warm and dry NEUROLOGIC:  Alert and oriented x 3 PSYCHIATRIC:  Normal affect    Signed, Shirlee More, MD  09/11/2018 9:56 AM    [place]

## 2018-09-11 NOTE — Patient Instructions (Addendum)
Medication Instructions:  Your physician has recommended you make the following change in your medication:  START ENTRESTO 24/26 (1 Tab) daily START: Toresmide 20mg  (1Tab) every other day. Take additional 20mg  if weight is greater than 125 lbs  CALL IF SYSTOLIC LESS THAN 95  If you need a refill on your cardiac medications before your next appointment, please call your pharmacy.   Lab work: None  If you have labs (blood work) drawn today and your tests are completely normal, you will receive your results only by: Marland Kitchen MyChart Message (if you have MyChart) OR . A paper copy in the mail If you have any lab test that is abnormal or we need to change your treatment, we will call you to review the results.  Testing/Procedures: Your physician has requested that you have an echocardiogram. Echocardiography is a painless test that uses sound waves to create images of your heart. It provides your doctor with information about the size and shape of your heart and how well your heart's chambers and valves are working. This procedure takes approximately one hour. There are no restrictions for this procedure.    Follow-Up: At [facility], you and your health needs are our priority.  As part of our continuing mission to provide you with exceptional heart care, we have created designated Provider Care Teams.  These Care Teams include your primary Cardiologist (physician) and Advanced Practice Providers (APPs -  Physician Assistants and Nurse Practitioners) who all work together to provide you with the care you need, when you need it. You will need a follow up appointment in 1 years.  Please call our office 2 months in advance to schedule this appointment.  You may see No primary care provider on file. or another member of our [employer] Provider Team in [place]: Jenne Campus, MD . Jyl Heinz, MD  Any Other Special Instructions Will Be Listed Below (If Applicable).   Echocardiogram An  echocardiogram is a procedure that uses painless sound waves (ultrasound) to produce an image of the heart. Images from an echocardiogram can provide important information about:  Signs of coronary artery disease (CAD).  Aneurysm detection. An aneurysm is a weak or damaged part of an artery wall that bulges out from the normal force of blood pumping through the body.  Heart size and shape. Changes in the size or shape of the heart can be associated with certain conditions, including heart failure, aneurysm, and CAD.  Heart muscle function.  Heart valve function.  Signs of a past heart attack.  Fluid buildup around the heart.  Thickening of the heart muscle.  A tumor or infectious growth around the heart valves. Tell a health care provider about:  Any allergies you have.  All medicines you are taking, including vitamins, herbs, eye drops, creams, and over-the-counter medicines.  Any blood disorders you have.  Any surgeries you have had.  Any medical conditions you have.  Whether you are pregnant or may be pregnant. What are the risks? Generally, this is a safe procedure. However, problems may occur, including:  Allergic reaction to dye (contrast) that may be used during the procedure. What happens before the procedure? No specific preparation is needed. You may eat and drink normally. What happens during the procedure?   An IV tube may be inserted into one of your veins.  You may receive contrast through this tube. A contrast is an injection that improves the quality of the pictures from your heart.  A gel will be  applied to your chest.  A wand-like tool (transducer) will be moved over your chest. The gel will help to transmit the sound waves from the transducer.  The sound waves will harmlessly bounce off of your heart to allow the heart images to be captured in real-time motion. The images will be recorded on a computer. The procedure may vary among health care  providers and hospitals. What happens after the procedure?  You may return to your normal, everyday life, including diet, activities, and medicines, unless your health care provider tells you not to do that. Summary  An echocardiogram is a procedure that uses painless sound waves (ultrasound) to produce an image of the heart.  Images from an echocardiogram can provide important information about the size and shape of your heart, heart muscle function, heart valve function, and fluid buildup around your heart.  You do not need to do anything to prepare before this procedure. You may eat and drink normally.  After the echocardiogram is completed, you may return to your normal, everyday life, unless your health care provider tells you not to do that. This information is not intended to replace advice given to you by your health care provider. Make sure you discuss any questions you have with your health care provider. Document Released: 05/10/2000 Document Revised: 06/15/2016 Document Reviewed: 06/15/2016 Elsevier Interactive Patient Education  2019 [place] yourself every morning when you first wake up and record on a calender or note pad, bring this to your office visits. Using a pill tender can help with taking your medications consistently.  Limit your fluid intake to 2 liters daily  Limit your sodium intake to less than 2-3 grams daily. Ask if you need dietary teaching.  If you gain more than 3 pounds (from your dry weight ), double your dose of diuretic for the day.  If you gain more than 5 pounds (from your dry weight), double your dose of lasix and call your heart failure doctor.  Please do not smoke tobacco since it is very bad for your heart.  Please do not drink alcohol since it can worsen your heart failure.Also avoid OTC nonsteroidal drugs, such as advil, aleve and motrin.  Try to exercise for at least 30 minutes every  day because this will help your heart be more efficient. You may be eligible for supervised cardiac rehab, ask your physician.

## 2018-09-17 DIAGNOSIS — E785 Hyperlipidemia, unspecified: Secondary | ICD-10-CM | POA: Diagnosis not present

## 2018-09-17 DIAGNOSIS — Z79899 Other long term (current) drug therapy: Secondary | ICD-10-CM | POA: Diagnosis not present

## 2018-09-17 DIAGNOSIS — E1065 Type 1 diabetes mellitus with hyperglycemia: Secondary | ICD-10-CM | POA: Diagnosis not present

## 2018-09-17 DIAGNOSIS — R69 Illness, unspecified: Secondary | ICD-10-CM | POA: Diagnosis not present

## 2018-09-24 DIAGNOSIS — I499 Cardiac arrhythmia, unspecified: Secondary | ICD-10-CM | POA: Diagnosis not present

## 2018-10-07 ENCOUNTER — Other Ambulatory Visit: Payer: Medicare HMO

## 2018-10-14 ENCOUNTER — Other Ambulatory Visit: Payer: Self-pay

## 2018-10-14 ENCOUNTER — Ambulatory Visit (INDEPENDENT_AMBULATORY_CARE_PROVIDER_SITE_OTHER): Payer: Medicare HMO

## 2018-10-14 DIAGNOSIS — I427 Cardiomyopathy due to drug and external agent: Secondary | ICD-10-CM | POA: Diagnosis not present

## 2018-10-14 DIAGNOSIS — I5022 Chronic systolic (congestive) heart failure: Secondary | ICD-10-CM

## 2018-10-14 DIAGNOSIS — I420 Dilated cardiomyopathy: Secondary | ICD-10-CM

## 2018-10-14 DIAGNOSIS — T451X5A Adverse effect of antineoplastic and immunosuppressive drugs, initial encounter: Secondary | ICD-10-CM

## 2018-10-14 NOTE — Progress Notes (Signed)
Complete echocardiogram has been performed.  Jimmy Dominic Mahaney RDCS, RVT 

## 2018-10-15 ENCOUNTER — Telehealth: Payer: Self-pay | Admitting: *Deleted

## 2018-10-15 DIAGNOSIS — M25511 Pain in right shoulder: Secondary | ICD-10-CM | POA: Diagnosis not present

## 2018-10-15 DIAGNOSIS — E1065 Type 1 diabetes mellitus with hyperglycemia: Secondary | ICD-10-CM | POA: Diagnosis not present

## 2018-10-15 DIAGNOSIS — E785 Hyperlipidemia, unspecified: Secondary | ICD-10-CM | POA: Diagnosis not present

## 2018-10-15 DIAGNOSIS — Z79899 Other long term (current) drug therapy: Secondary | ICD-10-CM | POA: Diagnosis not present

## 2018-10-15 DIAGNOSIS — Z6820 Body mass index (BMI) 20.0-20.9, adult: Secondary | ICD-10-CM | POA: Diagnosis not present

## 2018-10-15 NOTE — Telephone Encounter (Signed)
Telephone call to patient. Informed of echo results and no change in therapy. Patient verbalized understanding.

## 2018-10-15 NOTE — Telephone Encounter (Signed)
-----   Message from Austin Miles, RN sent at 10/15/2018  8:41 AM EDT -----  ----- Message ----- From: Richardo Priest, MD Sent: 10/14/2018  12:59 PM EDT To: Stevan Born, CMA  Normal or stable result  Good report significantly better was very severe now moderate reduction in function, this is a good response and no change in meds

## 2018-10-15 NOTE — Telephone Encounter (Signed)
Left message to call back for echo results. 

## 2018-10-15 NOTE — Telephone Encounter (Signed)
Read the results for the echo to pt and she was very happy with this and thanked me for the information. Had no further questions.

## 2018-10-21 DIAGNOSIS — M7501 Adhesive capsulitis of right shoulder: Secondary | ICD-10-CM | POA: Diagnosis not present

## 2018-10-21 NOTE — Progress Notes (Signed)
Virtual Visit via Telephone Note   This visit type was conducted due to national recommendations for restrictions regarding the COVID-19 Pandemic (e.g. social distancing) in an effort to limit this patient's exposure and mitigate transmission in our community.  Due to her co-morbid illnesses, this patient is at least at moderate risk for complications without adequate follow up.  This format is felt to be most appropriate for this patient at this time.  The patient did not have access to video technology/had technical difficulties with video requiring transitioning to audio format only (telephone).  All issues noted in this document were discussed and addressed.  No physical exam could be performed with this format.  Please refer to the patient's chart for her  consent to telehealth for [facility].   Date:  10/21/2018   ID:  Ann Gilmore, DOB 1956/04/10, MRN [id number]  Patient Location: Home Provider Location: Office  PCP:  Janine Limbo, PA-C  Cardiologist:  Shirlee More, MD  Electrophysiologist:  None   Evaluation Performed:  Follow-Up Visit  Chief Complaint:  Heart failure  History of Present Illness:    Ann Gilmore is a 63 y.o. 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  She recurred in 2004 and that time was treated again with chemotherapy followed by stem cell transplantation.  Subsequently had stage IIa left breast cancer 2009 received a left mastectomy with previous mantle field radiation and received chemotherapy with Taxotere and Cytoxan for 6 cycles.  In summary she is received both radiation as as well as anthracycline chemotherapy.   Echo 10/14/18 with a marked improvement with GDMT. IMPRESSIONS  1. The left ventricle has moderately reduced systolic  function, with an ejection fraction of 35-40%. The cavity size was mildly dilated. Left ventricular diastolic Doppler parameters are consistent with pseudonormalization.  2. The right ventricle has normal systolic function. The cavity was normal. There is no increase in right ventricular wall thickness.  3. Left atrial size was mildly dilated.  4. The aortic valve has an indeterminate number of cusps. Moderate thickening of the aortic valve. Mild calcification of the aortic valve. Aortic valve regurgitation was not assessed by color flow Doppler.  The patient does not have symptoms concerning for COVID-19 infection (fever, chills, cough, or new shortness of breath).   She has had a market increase in the quality of her life and tells me that she is beginning to feel good yesterday she had a intra-articular steroid in the shoulder with good symptomatic relief of chronic pain.  She is improved so much that she is outdoors and has a vegetable garden and is not having shortness of breath edema wheezing orthopnea chest pain palpitation or syncope and feels that she is beginning to have a normal life.  Her COPD is also stable.  She is quite encouraged by the significant improvement in ejection fraction as well as her heart failure which is now New York Heart Association class I.  She sleeps on 2 pillows out of habit unfortunately she still has blood pressures down the range of 90 systolic and I think we fit the maximally tolerated dose of Entresto.  I reviewed with her her testing echocardiogram that shows an ejection fraction with significant improvement presently out of the range of prophylactic ICD and extended heart  rhythm monitor showing no complex ventricular arrhythmia and improvement in her BNP levels.  We decided to continue current treatment including Entresto minimum dose her loop diuretic reduced Spironolactone and purposely we have avoided beta-blockers with her severe COPD I will see her back in my  office in 3 months we will recheck an echocardiogram sometime towards the end of the year and perhaps we could consider at the next visit switching her diuretic to as needed and trying uptitrate Entresto.  She is comfortable with this approach.  For safety and watch renal function potassium will recheck labs at the end of June BMP and a proBNP level.   Past Medical History:  Diagnosis Date   Breast cancer ([place])    Cancer ([place])    COPD (chronic obstructive pulmonary disease) ([place])    Diabetes mellitus without complication ([place])    Hodgkin lymphoma ([place])    Hyperlipidemia    Past Surgical History:  Procedure Laterality Date   CARPAL TUNNEL RELEASE     LIMBAL STEM CELL TRANSPLANT     MASTECTOMY Bilateral    OVARIAN CYST SURGERY     PORTA CATH INSERTION       No outpatient medications have been marked as taking for the 10/22/18 encounter (Appointment) with Richardo Priest, MD.     Allergies:   Oxycodone-acetaminophen; Aspartame and phenylalanine; and Fire ant   Social History   Tobacco Use   Smoking status: Current Every Day Smoker    Packs/day: 0.25    Years: 40.00    Pack years: 10.00    Types: Cigarettes   Smokeless tobacco: Never Used  Substance Use Topics   Alcohol use: Yes    Comment: occ   Drug use: Not Currently     Family Hx: The patient's family history includes Diabetes in her father; Heart attack in her father; Hyperlipidemia in her mother; Hypertension in her mother.  ROS:   Please see the history of present illness.     All other systems reviewed and are negative.   Prior CV studies:   The following studies were reviewed today:  Study Highlights   A ZIO monitor was performed for 6 days 22 hours beginning 09/11/2018 to assess for arrhythmia associated with nonischemic cardi39myopathy.  The rhythm throughout was sinus with minimum maximum and average heart rates of 86, 120 to 103 bpm.  There are no episodes of atrial fibrillation or  flutter.  There were no pauses of 3 seconds or greater and no episodes of second or third-degree AV block or sinus node exit block.  There were no symptomatic or triggered events.  Ventricular ectopy was rare less than 1% isolated PVCs.  There were no episodes of repetitive PVCs or ventricular tachycardia  Supraventricular ectopy was rare less than 1% and no episodes of atrial fibrillation or flutter.  There were 7 brief runs of atrial premature contractions the longest and fastest at 10 complexes rate of 182 bpm, paroxysmal atrial tachycardia.   Conclusion, unremarkable 7-day ZIO monitor especially with rare ventricular ectopy in the setting of nonischemic cardiomyopathy.     Labs/Other Tests and Data Reviewed:    EKG: Performed 08/07/2018 shows sinus rhythm narrow QRS normal QT interval nonspecific T waves, she does not have a left bundle branch block or conduction delay  Recent Labs: 08/19/2018: BUN 18; Creatinine, Ser 0.74; NT-Pro BNP 2,028; Potassium 4.2; Sodium 141    Ref Range & Units 83mo ago (08/19/18) 79mo ago (08/07/18)  NT-Pro BNP 0 - 287  pg/mL 2,028High   1,766High  CM     Recent Lipid Panel No results found for: CHOL, TRIG, HDL, CHOLHDL, LDLCALC, LDLDIRECT  Wt Readings from Last 3 Encounters:  09/11/18 116 lb 12.8 oz (53 kg)  09/02/18 123 lb (55.8 kg)  08/17/18 137 lb (62.1 kg)     Objective:    Vital Signs:  There were no vitals taken for this visit.   VITAL SIGNS:  reviewed her mood affect thought and cognition are normal alert and oriented no wheezing or respiratory distress in conversation  ASSESSMENT & PLAN:    1. heart failure markedly improved objectively by ejection fraction symptomatically presently New York Heart Association class I on maximally  tolerated guideline directed therapy recheck labs for safety and efficacy in 1 month BMP proBNP level see back in the office in 3 months and plan a follow-up echocardiogram towards in the year I  reviewed the results with her ejection fraction and her extended heart rhythm monitor at this time I do not think she requires ICD therapy.  She is comfortable with this approach has very good healthcare literacy and is meticulous in weighing daily staying with 1 within 1 pound restricting sodium and following blood pressure daily at home. 2. Chemotherapy-induced cardiomyopathy improved at this time would not advise prophylactic ICD therapy and she is on maximally tolerated guideline directed therapy 3. COPD is improved continue her current bronchodilators managed by her PCP 4. Dyslipidemia stable continue her statin 5. Type 2 diabetes stable if she requires additional therapy SGLT 2 inhibitor would be appropriate with heart failure  COVID-19 Education: The signs and symptoms of COVID-19 were discussed with the patient and how to seek care for testing (follow up with PCP or arrange E-visit).  The importance of social distancing was discussed today.  Time:   Today, I have spent 25 minutes with the patient with telehealth technology discussing the above problems.     Medication Adjustments/Labs and Tests Ordered: Current medicines are reviewed at length with the patient today.  Concerns regarding medicines are outlined above.   Tests Ordered: No orders of the defined types were placed in this encounter.   Medication Changes: No orders of the defined types were placed in this encounter.   Disposition:  Follow up in 3 month(s)  Signed, Shirlee More, MD  10/21/2018 1:58 PM    [facility]

## 2018-10-22 ENCOUNTER — Telehealth (INDEPENDENT_AMBULATORY_CARE_PROVIDER_SITE_OTHER): Payer: Medicare HMO | Admitting: Cardiology

## 2018-10-22 ENCOUNTER — Other Ambulatory Visit: Payer: Self-pay

## 2018-10-22 ENCOUNTER — Encounter: Payer: Self-pay | Admitting: Cardiology

## 2018-10-22 VITALS — BP 119/71 | Wt 119.0 lb

## 2018-10-22 DIAGNOSIS — T451X5A Adverse effect of antineoplastic and immunosuppressive drugs, initial encounter: Secondary | ICD-10-CM

## 2018-10-22 DIAGNOSIS — J449 Chronic obstructive pulmonary disease, unspecified: Secondary | ICD-10-CM

## 2018-10-22 DIAGNOSIS — Z794 Long term (current) use of insulin: Secondary | ICD-10-CM

## 2018-10-22 DIAGNOSIS — I5022 Chronic systolic (congestive) heart failure: Secondary | ICD-10-CM

## 2018-10-22 DIAGNOSIS — E119 Type 2 diabetes mellitus without complications: Secondary | ICD-10-CM

## 2018-10-22 DIAGNOSIS — E1169 Type 2 diabetes mellitus with other specified complication: Secondary | ICD-10-CM

## 2018-10-22 DIAGNOSIS — E785 Hyperlipidemia, unspecified: Secondary | ICD-10-CM

## 2018-10-22 DIAGNOSIS — I427 Cardiomyopathy due to drug and external agent: Secondary | ICD-10-CM

## 2018-10-22 NOTE — Patient Instructions (Signed)
Medication Instructions:  Your physician recommends that you continue on your current medications as directed. Please refer to the Current Medication list given to you today.  If you need a refill on your cardiac medications before your next appointment, please call your pharmacy.   Lab work: Your physician recommends that you return for lab work in: End of June BMP,Pro BNP   If you have labs (blood work) drawn today and your tests are completely normal, you will receive your results only by: Marland Kitchen MyChart Message (if you have MyChart) OR . A paper copy in the mail If you have any lab test that is abnormal or we need to change your treatment, we will call you to review the results.  Testing/Procedures: None  Follow-Up: At [facility], you and your health needs are our priority.  As part of our continuing mission to provide you with exceptional heart care, we have created designated Provider Care Teams.  These Care Teams include your primary Cardiologist (physician) and Advanced Practice Providers (APPs -  Physician Assistants and Nurse Practitioners) who all work together to provide you with the care you need, when you need it. You will need a follow up appointment in 3 months.   Any Other Special Instructions Will Be Listed Below (If Applicable).

## 2018-10-29 DIAGNOSIS — M25511 Pain in right shoulder: Secondary | ICD-10-CM | POA: Diagnosis not present

## 2018-10-29 DIAGNOSIS — M6281 Muscle weakness (generalized): Secondary | ICD-10-CM | POA: Diagnosis not present

## 2018-11-12 DIAGNOSIS — M25511 Pain in right shoulder: Secondary | ICD-10-CM | POA: Diagnosis not present

## 2018-11-12 DIAGNOSIS — M6281 Muscle weakness (generalized): Secondary | ICD-10-CM | POA: Diagnosis not present

## 2018-11-13 DIAGNOSIS — Z1339 Encounter for screening examination for other mental health and behavioral disorders: Secondary | ICD-10-CM | POA: Diagnosis not present

## 2018-11-13 DIAGNOSIS — Z0000 Encounter for general adult medical examination without abnormal findings: Secondary | ICD-10-CM | POA: Diagnosis not present

## 2018-11-13 DIAGNOSIS — Z9181 History of falling: Secondary | ICD-10-CM | POA: Diagnosis not present

## 2018-11-13 DIAGNOSIS — E785 Hyperlipidemia, unspecified: Secondary | ICD-10-CM | POA: Diagnosis not present

## 2018-11-19 DIAGNOSIS — M25511 Pain in right shoulder: Secondary | ICD-10-CM | POA: Diagnosis not present

## 2018-11-19 DIAGNOSIS — M6281 Muscle weakness (generalized): Secondary | ICD-10-CM | POA: Diagnosis not present

## 2018-11-24 DIAGNOSIS — I5022 Chronic systolic (congestive) heart failure: Secondary | ICD-10-CM | POA: Diagnosis not present

## 2018-11-24 DIAGNOSIS — I427 Cardiomyopathy due to drug and external agent: Secondary | ICD-10-CM | POA: Diagnosis not present

## 2018-11-24 DIAGNOSIS — T451X5A Adverse effect of antineoplastic and immunosuppressive drugs, initial encounter: Secondary | ICD-10-CM | POA: Diagnosis not present

## 2018-11-25 LAB — BASIC METABOLIC PANEL
BUN/Creatinine Ratio: 32 — ABNORMAL HIGH (ref 12–28)
BUN: 26 mg/dL (ref 8–27)
CO2: 23 mmol/L (ref 20–29)
Calcium: 9.7 mg/dL (ref 8.7–10.3)
Chloride: 93 mmol/L — ABNORMAL LOW (ref 96–106)
Creatinine, Ser: 0.82 mg/dL (ref 0.57–1.00)
GFR calc Af Amer: 90 mL/min/{1.73_m2} (ref >59)
GFR calc non Af Amer: 78 mL/min/{1.73_m2} (ref >59)
Glucose: 265 mg/dL — ABNORMAL HIGH (ref 65–99)
Potassium: 4.4 mmol/L (ref 3.5–5.2)
Sodium: 137 mmol/L (ref 134–144)

## 2018-11-25 LAB — PRO B NATRIURETIC PEPTIDE: NT-Pro BNP: 311 pg/mL — ABNORMAL HIGH (ref 0–287)

## 2018-11-26 DIAGNOSIS — M25511 Pain in right shoulder: Secondary | ICD-10-CM | POA: Diagnosis not present

## 2018-11-26 DIAGNOSIS — M6281 Muscle weakness (generalized): Secondary | ICD-10-CM | POA: Diagnosis not present

## 2018-11-26 DIAGNOSIS — M7501 Adhesive capsulitis of right shoulder: Secondary | ICD-10-CM | POA: Diagnosis not present

## 2018-12-03 DIAGNOSIS — M6281 Muscle weakness (generalized): Secondary | ICD-10-CM | POA: Diagnosis not present

## 2018-12-03 DIAGNOSIS — M25511 Pain in right shoulder: Secondary | ICD-10-CM | POA: Diagnosis not present

## 2018-12-09 DIAGNOSIS — M25511 Pain in right shoulder: Secondary | ICD-10-CM | POA: Diagnosis not present

## 2018-12-12 DIAGNOSIS — R69 Illness, unspecified: Secondary | ICD-10-CM | POA: Diagnosis not present

## 2018-12-14 DIAGNOSIS — M7501 Adhesive capsulitis of right shoulder: Secondary | ICD-10-CM | POA: Diagnosis not present

## 2018-12-14 DIAGNOSIS — M25511 Pain in right shoulder: Secondary | ICD-10-CM | POA: Diagnosis not present

## 2018-12-17 DIAGNOSIS — M25511 Pain in right shoulder: Secondary | ICD-10-CM | POA: Diagnosis not present

## 2018-12-17 DIAGNOSIS — E1065 Type 1 diabetes mellitus with hyperglycemia: Secondary | ICD-10-CM | POA: Diagnosis not present

## 2018-12-17 DIAGNOSIS — E785 Hyperlipidemia, unspecified: Secondary | ICD-10-CM | POA: Diagnosis not present

## 2018-12-17 DIAGNOSIS — G43909 Migraine, unspecified, not intractable, without status migrainosus: Secondary | ICD-10-CM | POA: Diagnosis not present

## 2018-12-17 DIAGNOSIS — M6281 Muscle weakness (generalized): Secondary | ICD-10-CM | POA: Diagnosis not present

## 2018-12-17 DIAGNOSIS — Z6820 Body mass index (BMI) 20.0-20.9, adult: Secondary | ICD-10-CM | POA: Diagnosis not present

## 2018-12-17 DIAGNOSIS — Z79899 Other long term (current) drug therapy: Secondary | ICD-10-CM | POA: Diagnosis not present

## 2018-12-17 DIAGNOSIS — I5020 Unspecified systolic (congestive) heart failure: Secondary | ICD-10-CM | POA: Diagnosis not present

## 2018-12-17 DIAGNOSIS — J449 Chronic obstructive pulmonary disease, unspecified: Secondary | ICD-10-CM | POA: Diagnosis not present

## 2018-12-23 DIAGNOSIS — M25511 Pain in right shoulder: Secondary | ICD-10-CM | POA: Diagnosis not present

## 2018-12-23 DIAGNOSIS — M6281 Muscle weakness (generalized): Secondary | ICD-10-CM | POA: Diagnosis not present

## 2018-12-30 DIAGNOSIS — M25511 Pain in right shoulder: Secondary | ICD-10-CM | POA: Diagnosis not present

## 2018-12-30 DIAGNOSIS — M6281 Muscle weakness (generalized): Secondary | ICD-10-CM | POA: Diagnosis not present

## 2019-01-06 DIAGNOSIS — M6281 Muscle weakness (generalized): Secondary | ICD-10-CM | POA: Diagnosis not present

## 2019-01-06 DIAGNOSIS — M25511 Pain in right shoulder: Secondary | ICD-10-CM | POA: Diagnosis not present

## 2019-01-21 DIAGNOSIS — M25511 Pain in right shoulder: Secondary | ICD-10-CM | POA: Diagnosis not present

## 2019-01-25 ENCOUNTER — Ambulatory Visit: Payer: Medicare HMO | Admitting: Cardiology

## 2019-03-03 DIAGNOSIS — R69 Illness, unspecified: Secondary | ICD-10-CM | POA: Diagnosis not present

## 2019-03-18 DIAGNOSIS — I5020 Unspecified systolic (congestive) heart failure: Secondary | ICD-10-CM | POA: Diagnosis not present

## 2019-03-18 DIAGNOSIS — J449 Chronic obstructive pulmonary disease, unspecified: Secondary | ICD-10-CM | POA: Diagnosis not present

## 2019-03-18 DIAGNOSIS — E785 Hyperlipidemia, unspecified: Secondary | ICD-10-CM | POA: Diagnosis not present

## 2019-03-18 DIAGNOSIS — Z6821 Body mass index (BMI) 21.0-21.9, adult: Secondary | ICD-10-CM | POA: Diagnosis not present

## 2019-03-18 DIAGNOSIS — G43909 Migraine, unspecified, not intractable, without status migrainosus: Secondary | ICD-10-CM | POA: Diagnosis not present

## 2019-03-18 DIAGNOSIS — Z79899 Other long term (current) drug therapy: Secondary | ICD-10-CM | POA: Diagnosis not present

## 2019-03-18 DIAGNOSIS — E1065 Type 1 diabetes mellitus with hyperglycemia: Secondary | ICD-10-CM | POA: Diagnosis not present

## 2019-03-31 NOTE — Progress Notes (Signed)
Cardiology Office Note:    Date:  04/01/2019   ID:  Ann Gilmore, DOB [birth year], MRN [id number]  PCP:  Ann Limbo, PA-C  Cardiologist:  Ann More, MD    Referring MD: Ann Limbo, PA-C    ASSESSMENT:    1. Chronic systolic (congestive) heart failure ([place])   2. Chemotherapy induced cardiomyopathy ([place])   3. Chronic obstructive pulmonary disease, unspecified COPD type ([place])   4. Type 2 diabetes mellitus without complication, with long-term current use of insulin ([place])   5. Dyslipidemia associated with type 2 diabetes mellitus ([place])   6. Orthostatic hypotension    PLAN:    In order of problems listed above:  1. She has markedly improved New York Heart Association class II but unfortunately is drug intolerant either related to he new diabetic medication or progressive heart disease.  I will ask her to recheck an echocardiogram reduce the dose of Entresto to 2 tablets a week for class effect.  She no longer is on distal diuretic or MRA. 2. Recheck echocardiogram 3. Stable continue to use her inhalers as needed 4. Poorly controlled 5. Poorly controlled she has been transition from ACE inhibitor to [employer]. 6. Multiple potential angina etiologies including neuropathy progressive left ventricular dysfunction side effects of diabetic medicines see plan above   Next appointment: 3 months   Medication Adjustments/Labs and Tests Ordered: Current medicines are reviewed at length with the patient today.  Concerns regarding medicines are outlined above.  No orders of the defined types were placed in this encounter.  No orders of the defined types were placed in this encounter.   Chief Complaint  Patient presents with  . Cardiomyopathy  . Congestive Heart Failure  . 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  She recurred in 2004 and that time was treated again with chemotherapy followed by stem cell transplantation.  Subsequently had stage IIa left breast cancer 2009 received a left mastectomy with previous mantle field radiation and received chemotherapy with Taxotere and Cytoxan for 6 cycles.  In summary she is received both radiation as as well as anthracycline chemotherapy.    Echo 10/14/18 with a marked improvement with GDMT. IMPRESSIONS  1. The left ventricle has moderately reduced systolic function, with an ejection fraction of 35-40%. The cavity size was mildly dilated. Left ventricular diastolic Doppler parameters are consistent with pseudonormalization.  2. The right ventricle has normal systolic function. The cavity was normal. There is no increase in right ventricular wall thickness.  3. Left atrial size was mildly dilated.  4. The aortic valve has an indeterminate number of cusps. Moderate thickening of the aortic valve. Mild calcification of the aortic valve. Aortic valve regurgitation was not assessed by color flow Doppler.  She wants last seen 10/22/2018 with a virtual visit.  Her heart failure was markedly improved New York Heart Association functional class I after shared decision making I did not advise prophylactic ICD therapy.  Her COPD was improved stable as was her diabetes and dyslipidemia..  She also had a marked improvement in proBNP level.   79mo ago (11/24/18) 27mo ago (08/19/18) 66mo ago (08/07/18)   NT-Pro BNP 0 - 287 pg/mL 311High  2,028High  CM  1,766    Compliance with diet, lifestyle and medications: Yes  Recent labs from her primary care physician show very poorly controlled diabetes A1c 12.8 random glucose 380 cholesterol 318 LDL 111 HDL 61 normal creatinine.  Her problem is orthostatic dizziness and hypotension and she rarely takes either her  spironolactone or Entresto because of it.  Today in the office her standing blood pressure is about [id number] systolic.  I decided to reduce her Entresto to 1 tablet 2 days a week for class effect stop her MRA and she takes SGLT2 inhibitor [place] Which has a distal diuretic effect and I told her if she continues to be hypotensive to call and I will place her on midodrine.  No edema orthopnea palpitation or syncope. Past Medical History:  Diagnosis Date  . Breast cancer ([place])   . Cancer ([place])   . COPD (chronic obstructive pulmonary disease) ([place])   . Diabetes mellitus without complication ([place])   . Hodgkin lymphoma ([place])   . Hyperlipidemia     Past Surgical History:  Procedure Laterality Date  . CARPAL TUNNEL RELEASE    . LIMBAL STEM CELL TRANSPLANT    . MASTECTOMY Bilateral   . OVARIAN CYST SURGERY    . PORTA CATH INSERTION      Current Medications: Current Meds  Medication Sig  . albuterol (PROVENTIL HFA;VENTOLIN HFA) 108 (90 Base) MCG/ACT inhaler INHALE 1 TO 2 PUFFS BY MOUTH EVERY 4 TO 6 HOURS AS NEEDED  . colesevelam (WELCHOL) 625 MG tablet Take 1,250 mg by mouth 2 (two) times daily.  Marland Kitchen FARXIGA 10 MG TABS tablet Take 10 mg by mouth every morning.  . sacubitril-valsartan (ENTRESTO) 24-26 MG Take 1 tablet by mouth daily.  . SYMBICORT 160-4.5 MCG/ACT inhaler Inhale 1 puff into the lungs 2 (two) times daily.  . TRESIBA FLEXTOUCH 100 UNIT/ML SOPN FlexTouch Pen Inject 40 Units into the skin daily.      Allergies:   Oxycodone-acetaminophen, Aspartame and phenylalanine, and Fire ant   Social History   Socioeconomic History  . Marital status: Married    Spouse name: Not on file  . Number of children: Not on file  . Years of education: Not on file  . Highest education level: Not on file  Occupational History  . Not on file  Social Needs  . Financial resource strain: Not on file  . Food insecurity    Worry: Not on file    Inability: Not on file  . Transportation needs    Medical:  Not on file    Non-medical: Not on file  Tobacco Use  . Smoking status: Current Every Day Smoker    Packs/day: 0.25    Years: 40.00    Pack years: 10.00    Types: Cigarettes  . Smokeless tobacco: Never Used  Substance and Sexual Activity  . Alcohol use: Yes    Comment: occ  . Drug use: Not Currently  . Sexual activity: Not on file  Lifestyle  . Physical activity    Days per week: Not on file    Minutes per session: Not on file  . Stress: Not on file  Relationships  . Social Herbalist on phone: Not on file    Gets together: Not on file    Attends religious service: Not on file    Active member of club or organization: Not on file    Attends meetings of clubs or organizations: Not on file  Relationship status: Not on file  Other Topics Concern  . Not on file  Social History Narrative  . Not on file     Family History: The patient's family history includes Diabetes in her father; Heart attack in her father; Hyperlipidemia in her mother; Hypertension in her mother. ROS:   Please see the history of present illness.    All other systems reviewed and are negative.  EKGs/Labs/Other Studies Reviewed:    The following studies were reviewed today:    Recent Labs: 11/24/2018: BUN 26; Creatinine, Ser 0.82; NT-Pro BNP 311; Potassium 4.4; Sodium 137  Recent Lipid Panel No results found for: CHOL, TRIG, HDL, CHOLHDL, VLDL, LDLCALC, LDLDIRECT  Physical Exam:    VS:  BP 116/76 (BP Location: Left Arm, Patient Position: Sitting, Cuff Size: Normal)   Pulse 90   Ht 5' 4.5" (1.638 m)   Wt 126 lb 12.8 oz (57.5 kg)   SpO2 94%   BMI 21.43 kg/m     Wt Readings from Last 3 Encounters:  04/01/19 126 lb 12.8 oz (57.5 kg)  10/22/18 119 lb (54 kg)  09/11/18 116 lb 12.8 oz (53 kg)     GEN: Very frail well nourished, well developed in no acute distress HEENT: Normal NECK: No JVD; No carotid bruits LYMPHATICS: No lymphadenopathy CARDIAC: RRR, no murmurs, rubs, gallops  RESPIRATORY:  Clear to auscultation without rales, wheezing or rhonchi  ABDOMEN: Soft, non-tender, non-distended MUSCULOSKELETAL:  No edema; No deformity  SKIN: Warm and dry NEUROLOGIC:  Alert and oriented x 3 PSYCHIATRIC:  Normal affect    Signed, Ann More, MD  04/01/2019 3:14 PM    [facility]

## 2019-04-01 ENCOUNTER — Other Ambulatory Visit: Payer: Self-pay

## 2019-04-01 ENCOUNTER — Ambulatory Visit (INDEPENDENT_AMBULATORY_CARE_PROVIDER_SITE_OTHER): Payer: Medicare HMO | Admitting: Cardiology

## 2019-04-01 ENCOUNTER — Encounter: Payer: Self-pay | Admitting: Cardiology

## 2019-04-01 VITALS — BP 116/76 | HR 90 | Ht 64.5 in | Wt 126.8 lb

## 2019-04-01 DIAGNOSIS — I427 Cardiomyopathy due to drug and external agent: Secondary | ICD-10-CM | POA: Diagnosis not present

## 2019-04-01 DIAGNOSIS — Z794 Long term (current) use of insulin: Secondary | ICD-10-CM

## 2019-04-01 DIAGNOSIS — E1169 Type 2 diabetes mellitus with other specified complication: Secondary | ICD-10-CM

## 2019-04-01 DIAGNOSIS — T451X5A Adverse effect of antineoplastic and immunosuppressive drugs, initial encounter: Secondary | ICD-10-CM

## 2019-04-01 DIAGNOSIS — T451X5D Adverse effect of antineoplastic and immunosuppressive drugs, subsequent encounter: Secondary | ICD-10-CM | POA: Diagnosis not present

## 2019-04-01 DIAGNOSIS — E119 Type 2 diabetes mellitus without complications: Secondary | ICD-10-CM

## 2019-04-01 DIAGNOSIS — I951 Orthostatic hypotension: Secondary | ICD-10-CM

## 2019-04-01 DIAGNOSIS — E785 Hyperlipidemia, unspecified: Secondary | ICD-10-CM

## 2019-04-01 DIAGNOSIS — I5022 Chronic systolic (congestive) heart failure: Secondary | ICD-10-CM | POA: Diagnosis not present

## 2019-04-01 DIAGNOSIS — J449 Chronic obstructive pulmonary disease, unspecified: Secondary | ICD-10-CM

## 2019-04-01 MED ORDER — ENTRESTO 24-26 MG PO TABS: ORAL_TABLET | ORAL | 3 refills | Status: DC

## 2019-04-01 MED ORDER — TORSEMIDE 20 MG PO TABS: ORAL_TABLET | ORAL | 6 refills | Status: DC

## 2019-04-01 NOTE — Patient Instructions (Signed)
Medication Instructions:  Your physician has recommended you make the following change in your medication:   CHANGE torsemide (demadex) 20 mg: Take 1 tablet daily as needed for weight gain or swelling  DECREASE sacubitril-valsartan (entresto) 24-26 mg: Take 1 tablet twice weekly  *If you need a refill on your cardiac medications before your next appointment, please call your pharmacy*  Lab Work: None  If you have labs (blood work) drawn today and your tests are completely normal, you will receive your results only by: Marland Kitchen MyChart Message (if you have MyChart) OR . A paper copy in the mail If you have any lab test that is abnormal or we need to change your treatment, we will call you to review the results.  Testing/Procedures: Your physician has requested that you have an echocardiogram. Echocardiography is a painless test that uses sound waves to create images of your heart. It provides your doctor with information about the size and shape of your heart and how well your heart's chambers and valves are working. This procedure takes approximately one hour. There are no restrictions for this procedure.  Follow-Up: At [facility], you and your health needs are our priority.  As part of our continuing mission to provide you with exceptional heart care, we have created designated Provider Care Teams.  These Care Teams include your primary Cardiologist (physician) and Advanced Practice Providers (APPs -  Physician Assistants and Nurse Practitioners) who all work together to provide you with the care you need, when you need it.  Your next appointment:   3 months  The format for your next appointment:   In Person  Provider:   Shirlee More, MD  Other Instructions **Check your BP daily at the same time every day and contact our office with any questions or concerns regarding your BP.

## 2019-05-24 ENCOUNTER — Other Ambulatory Visit: Payer: Self-pay

## 2019-05-24 ENCOUNTER — Ambulatory Visit (INDEPENDENT_AMBULATORY_CARE_PROVIDER_SITE_OTHER): Payer: Medicare HMO

## 2019-05-24 DIAGNOSIS — I5022 Chronic systolic (congestive) heart failure: Secondary | ICD-10-CM

## 2019-05-24 DIAGNOSIS — I951 Orthostatic hypotension: Secondary | ICD-10-CM | POA: Diagnosis not present

## 2019-05-24 DIAGNOSIS — T451X5D Adverse effect of antineoplastic and immunosuppressive drugs, subsequent encounter: Secondary | ICD-10-CM

## 2019-05-24 DIAGNOSIS — I427 Cardiomyopathy due to drug and external agent: Secondary | ICD-10-CM | POA: Diagnosis not present

## 2019-05-24 NOTE — Progress Notes (Signed)
Complete echocardiogram has been performed.  Jimmy Beau [place] RDCS, RVT 

## 2019-07-11 NOTE — Progress Notes (Signed)
Cardiology Office Note:    Date:  07/13/2019   ID:  Ann Gilmore, DOB 01-03-1956, MRN [id number]  PCP:  Janine Limbo, PA-C  Cardiologist:  Shirlee More, MD    Referring MD: Janine Limbo, PA-C    ASSESSMENT:    1. Chronic systolic (congestive) heart failure ([place])   2. Chemotherapy induced cardiomyopathy ([place])   3. Chronic obstructive pulmonary disease, unspecified COPD type ([place])   4. Type 2 diabetes mellitus without complication, with long-term current use of insulin ([place])   5. Dyslipidemia associated with type 2 diabetes mellitus ([place])    PLAN:    In order of problems listed above:  1. He continues to improve New York Heart Association class I takes a paucity of medication and despite that her blood pressure is relatively low we will continue a minimum dose of Entresto with her profound clinical improvement.  Recent labs reviewed from PCP office. 2. Stable COPD continue bronchodilators 3. Poorly controlled diabetes now on SGLT2 inhibitor beneficial from a heart 4. Dyslipidemia now on WelChol labs are followed with PCP office   Next appointment: 6 months with point needed echocardiogram around that time   Medication Adjustments/Labs and Tests Ordered: Current medicines are reviewed at length with the patient today.  Concerns regarding medicines are outlined above.  No orders of the defined types were placed in this encounter.  No orders of the defined types were placed in this encounter.   Chief Complaint  Patient presents with  . Follow-up  . Congestive Heart Failure  . Cardiomyopathy    History of Present Illness:    Ann Gilmore is a 64 y.o. 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  With hypotension she takes a minimum dose of psychometrician 2 days a week is not on a beta-blocker with severe COPD and takes SGLT2 inhibitor with her diabetes.  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  She recurred in 2004 and that time was treated again with chemotherapy followed by stem cell transplantation.  Subsequently had stage IIa left breast cancer 2009 received a left mastectomy with previous mantle field radiation and received chemotherapy with Taxotere and Cytoxan for 6 cycles.  In summary she is received both radiation as as well as anthracycline chemotherapy.    Echo 10/14/18 with a marked improvement with GDMT. IMPRESSIONS  1. The left ventricle has moderately reduced systolic function, with an ejection fraction of 35-40%. The cavity size was mildly dilated. Left ventricular diastolic Doppler parameters are consistent with pseudonormalization.  2. The right ventricle has normal systolic function. The cavity was normal. There is no increase in right ventricular wall thickness.  3. Left atrial size was mildly dilated.  4. The aortic valve has an indeterminate number of cusps. Moderate thickening of the aortic valve. Mild calcification of the aortic valve. Aortic valve regurgitation was not assessed by color flow Doppler  She was last seen 04/01/2019. Compliance with diet, lifestyle and medications: Yes  She continues to feel well she is not short of breath no edema good exercise tolerance and strength her weight is stable Home blood pressure runs in the 80 to 90 mmHg range and she tolerates her intermittent Entresto 2 days a week with markedly elevated A1c 12.9 she is now on SGLT2 with him better  and with dyslipidemia she is taking WelChol and is intolerant to statin.  Recent labs include cholesterol 318 LDL 111 HDL 61 A1c 12.8% creatinine 0.65 03/18/2019 Past Medical History:  Diagnosis Date  . Breast cancer ([place])   . Cancer ([place])   . COPD (chronic obstructive pulmonary disease) ([place])   . Diabetes mellitus without complication ([place])   . Hodgkin  lymphoma ([place])   . Hyperlipidemia     Past Surgical History:  Procedure Laterality Date  . CARPAL TUNNEL RELEASE    . LIMBAL STEM CELL TRANSPLANT    . MASTECTOMY Bilateral   . OVARIAN CYST SURGERY    . PORTA CATH INSERTION      Current Medications: Current Meds  Medication Sig  . albuterol (PROVENTIL HFA;VENTOLIN HFA) 108 (90 Base) MCG/ACT inhaler INHALE 1 TO 2 PUFFS BY MOUTH EVERY 4 TO 6 HOURS AS NEEDED  . B-D UF III MINI PEN NEEDLES 31G X 5 MM MISC See admin instructions.  . colesevelam (WELCHOL) 625 MG tablet Take 1,250 mg by mouth 2 (two) times daily.  Marland Kitchen FARXIGA 10 MG TABS tablet Take 10 mg by mouth every morning.  . sacubitril-valsartan (ENTRESTO) 24-26 MG Take 1 tablet twice weekly.  . SYMBICORT 160-4.5 MCG/ACT inhaler Inhale 1 puff into the lungs 2 (two) times daily.  . TRESIBA FLEXTOUCH 100 UNIT/ML SOPN FlexTouch Pen Inject 40 Units into the skin daily.      Allergies:   Oxycodone-acetaminophen, Aspartame and phenylalanine, and Fire ant   Social History   Socioeconomic History  . Marital status: Married    Spouse name: Not on file  . Number of children: Not on file  . Years of education: Not on file  . Highest education level: Not on file  Occupational History  . Not on file  Tobacco Use  . Smoking status: Current Every Day Smoker    Packs/day: 0.25    Years: 40.00    Pack years: 10.00    Types: Cigarettes  . Smokeless tobacco: Never Used  Substance and Sexual Activity  . Alcohol use: Yes    Comment: occ  . Drug use: Not Currently  . Sexual activity: Yes  Other Topics Concern  . Not on file  Social History Narrative  . Not on file   Social Determinants of Health   Financial Resource Strain:   . Difficulty of Paying Living Expenses: Not on file  Food Insecurity:   . Worried About Charity fundraiser in the Last Year: Not on file  . Ran Out of Food in the Last Year: Not on file  Transportation Needs:   . Lack of Transportation (Medical): Not on file   . Lack of Transportation (Non-Medical): Not on file  Physical Activity:   . Days of Exercise per Week: Not on file  . Minutes of Exercise per Session: Not on file  Stress:   . Feeling of Stress : Not on file  Social Connections:   . Frequency of Communication with Friends and Family: Not on file  . Frequency of Social Gatherings with Friends and Family: Not on file  . Attends Religious Services: Not on file  . Active Member of Clubs or Organizations: Not on file  . Attends Archivist Meetings: Not on file  . Marital Status: Not on file     Family History: The patient's family history includes Diabetes in her father; Heart attack in her father; Hyperlipidemia in her mother; Hypertension in her mother. ROS:   Please  see the history of present illness.    All other systems reviewed and are negative.  EKGs/Labs/Other Studies Reviewed:    The following studies were reviewed today:   Recent Labs: 11/24/2018: BUN 26; Creatinine, Ser 0.82; NT-Pro BNP 311; Potassium 4.4; Sodium 137  Recent Lipid Panel No results found for: CHOL, TRIG, HDL, CHOLHDL, VLDL, LDLCALC, LDLDIRECT  Physical Exam:    VS:  Pulse 95   Temp (!) 97.1 F (36.2 C)   Ht 5' 4.5" (1.638 m)   Wt 135 lb (61.2 kg)   LMP  (LMP Unknown)   SpO2 96%   BMI 22.81 kg/m     Wt Readings from Last 3 Encounters:  07/13/19 135 lb (61.2 kg)  04/01/19 126 lb 12.8 oz (57.5 kg)  10/22/18 119 lb (54 kg)     GEN:  Well nourished, well developed in no acute distress HEENT: Normal NECK: No JVD; No carotid bruits LYMPHATICS: No lymphadenopathy CARDIAC: RRR, no murmurs, rubs, gallops RESPIRATORY:  Clear to auscultation without rales, wheezing or rhonchi  ABDOMEN: Soft, non-tender, non-distended MUSCULOSKELETAL:  No edema; No deformity  SKIN: Warm and dry NEUROLOGIC:  Alert and oriented x 3 PSYCHIATRIC:  Normal affect    Signed, Shirlee More, MD  07/13/2019 2:53 PM    [facility]

## 2019-07-13 ENCOUNTER — Other Ambulatory Visit: Payer: Self-pay

## 2019-07-13 ENCOUNTER — Ambulatory Visit (INDEPENDENT_AMBULATORY_CARE_PROVIDER_SITE_OTHER): Payer: Medicare HMO | Admitting: Cardiology

## 2019-07-13 ENCOUNTER — Encounter: Payer: Self-pay | Admitting: Cardiology

## 2019-07-13 VITALS — BP 80/60 | HR 95 | Temp 97.1°F | Ht 64.5 in | Wt 135.0 lb

## 2019-07-13 DIAGNOSIS — E119 Type 2 diabetes mellitus without complications: Secondary | ICD-10-CM | POA: Diagnosis not present

## 2019-07-13 DIAGNOSIS — J449 Chronic obstructive pulmonary disease, unspecified: Secondary | ICD-10-CM | POA: Diagnosis not present

## 2019-07-13 DIAGNOSIS — E785 Hyperlipidemia, unspecified: Secondary | ICD-10-CM | POA: Diagnosis not present

## 2019-07-13 DIAGNOSIS — T451X5A Adverse effect of antineoplastic and immunosuppressive drugs, initial encounter: Secondary | ICD-10-CM | POA: Diagnosis not present

## 2019-07-13 DIAGNOSIS — I5022 Chronic systolic (congestive) heart failure: Secondary | ICD-10-CM | POA: Diagnosis not present

## 2019-07-13 DIAGNOSIS — I427 Cardiomyopathy due to drug and external agent: Secondary | ICD-10-CM | POA: Diagnosis not present

## 2019-07-13 DIAGNOSIS — E1169 Type 2 diabetes mellitus with other specified complication: Secondary | ICD-10-CM | POA: Diagnosis not present

## 2019-07-13 DIAGNOSIS — Z794 Long term (current) use of insulin: Secondary | ICD-10-CM | POA: Diagnosis not present

## 2019-07-13 NOTE — Patient Instructions (Signed)
Medication Instructions:  Your physician recommends that you continue on your current medications as directed. Please refer to the Current Medication list given to you today.  *If you need a refill on your cardiac medications before your next appointment, please call your pharmacy*  Lab Work: None ordered  If you have labs (blood work) drawn today and your tests are completely normal, you will receive your results only by: Marland Kitchen MyChart Message (if you have MyChart) OR . A paper copy in the mail If you have any lab test that is abnormal or we need to change your treatment, we will call you to review the results.  Testing/Procedures: None orderd  Follow-Up: At [facility], you and your health needs are our priority.  As part of our continuing mission to provide you with exceptional heart care, we have created designated Provider Care Teams.  These Care Teams include your primary Cardiologist (physician) and Advanced Practice Providers (APPs -  Physician Assistants and Nurse Practitioners) who all work together to provide you with the care you need, when you need it.  Your next appointment:   6 month(s)  The format for your next appointment:   In Person  Provider:   Shirlee More, MD  Other Instructions

## 2019-08-04 DIAGNOSIS — G43909 Migraine, unspecified, not intractable, without status migrainosus: Secondary | ICD-10-CM | POA: Diagnosis not present

## 2019-08-04 DIAGNOSIS — G3184 Mild cognitive impairment, so stated: Secondary | ICD-10-CM | POA: Diagnosis not present

## 2019-08-04 DIAGNOSIS — Z794 Long term (current) use of insulin: Secondary | ICD-10-CM | POA: Diagnosis not present

## 2019-08-04 DIAGNOSIS — J449 Chronic obstructive pulmonary disease, unspecified: Secondary | ICD-10-CM | POA: Diagnosis not present

## 2019-08-04 DIAGNOSIS — E785 Hyperlipidemia, unspecified: Secondary | ICD-10-CM | POA: Diagnosis not present

## 2019-08-04 DIAGNOSIS — I509 Heart failure, unspecified: Secondary | ICD-10-CM | POA: Diagnosis not present

## 2019-08-04 DIAGNOSIS — Z9481 Bone marrow transplant status: Secondary | ICD-10-CM | POA: Diagnosis not present

## 2019-08-04 DIAGNOSIS — R69 Illness, unspecified: Secondary | ICD-10-CM | POA: Diagnosis not present

## 2019-08-04 DIAGNOSIS — E089 Diabetes mellitus due to underlying condition without complications: Secondary | ICD-10-CM | POA: Diagnosis not present

## 2019-09-16 DIAGNOSIS — E785 Hyperlipidemia, unspecified: Secondary | ICD-10-CM | POA: Diagnosis not present

## 2019-09-16 DIAGNOSIS — J449 Chronic obstructive pulmonary disease, unspecified: Secondary | ICD-10-CM | POA: Diagnosis not present

## 2019-09-16 DIAGNOSIS — Z6822 Body mass index (BMI) 22.0-22.9, adult: Secondary | ICD-10-CM | POA: Diagnosis not present

## 2019-09-16 DIAGNOSIS — Z87891 Personal history of nicotine dependence: Secondary | ICD-10-CM | POA: Diagnosis not present

## 2019-09-16 DIAGNOSIS — I5020 Unspecified systolic (congestive) heart failure: Secondary | ICD-10-CM | POA: Diagnosis not present

## 2019-09-16 DIAGNOSIS — E1065 Type 1 diabetes mellitus with hyperglycemia: Secondary | ICD-10-CM | POA: Diagnosis not present

## 2019-09-16 DIAGNOSIS — Z79899 Other long term (current) drug therapy: Secondary | ICD-10-CM | POA: Diagnosis not present

## 2019-09-16 DIAGNOSIS — G43909 Migraine, unspecified, not intractable, without status migrainosus: Secondary | ICD-10-CM | POA: Diagnosis not present

## 2019-09-30 DIAGNOSIS — J449 Chronic obstructive pulmonary disease, unspecified: Secondary | ICD-10-CM | POA: Diagnosis not present

## 2019-09-30 DIAGNOSIS — I5020 Unspecified systolic (congestive) heart failure: Secondary | ICD-10-CM | POA: Diagnosis not present

## 2019-09-30 DIAGNOSIS — E785 Hyperlipidemia, unspecified: Secondary | ICD-10-CM | POA: Diagnosis not present

## 2019-09-30 DIAGNOSIS — E1065 Type 1 diabetes mellitus with hyperglycemia: Secondary | ICD-10-CM | POA: Diagnosis not present

## 2019-10-25 ENCOUNTER — Other Ambulatory Visit: Payer: Self-pay | Admitting: Cardiology

## 2019-10-28 DIAGNOSIS — E109 Type 1 diabetes mellitus without complications: Secondary | ICD-10-CM | POA: Diagnosis not present

## 2019-11-17 DIAGNOSIS — E785 Hyperlipidemia, unspecified: Secondary | ICD-10-CM | POA: Diagnosis not present

## 2019-11-17 DIAGNOSIS — Z9181 History of falling: Secondary | ICD-10-CM | POA: Diagnosis not present

## 2019-11-17 DIAGNOSIS — Z0000 Encounter for general adult medical examination without abnormal findings: Secondary | ICD-10-CM | POA: Diagnosis not present

## 2019-11-17 DIAGNOSIS — Z1331 Encounter for screening for depression: Secondary | ICD-10-CM | POA: Diagnosis not present

## 2019-12-17 DIAGNOSIS — Z9484 Stem cells transplant status: Secondary | ICD-10-CM | POA: Diagnosis not present

## 2019-12-17 DIAGNOSIS — Z6821 Body mass index (BMI) 21.0-21.9, adult: Secondary | ICD-10-CM | POA: Diagnosis not present

## 2019-12-17 DIAGNOSIS — I5020 Unspecified systolic (congestive) heart failure: Secondary | ICD-10-CM | POA: Diagnosis not present

## 2019-12-17 DIAGNOSIS — G43909 Migraine, unspecified, not intractable, without status migrainosus: Secondary | ICD-10-CM | POA: Diagnosis not present

## 2019-12-17 DIAGNOSIS — E1065 Type 1 diabetes mellitus with hyperglycemia: Secondary | ICD-10-CM | POA: Diagnosis not present

## 2019-12-17 DIAGNOSIS — E785 Hyperlipidemia, unspecified: Secondary | ICD-10-CM | POA: Diagnosis not present

## 2019-12-17 DIAGNOSIS — Z79899 Other long term (current) drug therapy: Secondary | ICD-10-CM | POA: Diagnosis not present

## 2019-12-17 DIAGNOSIS — J449 Chronic obstructive pulmonary disease, unspecified: Secondary | ICD-10-CM | POA: Diagnosis not present

## 2020-01-13 DIAGNOSIS — M75111 Incomplete rotator cuff tear or rupture of right shoulder, not specified as traumatic: Secondary | ICD-10-CM | POA: Diagnosis not present

## 2020-01-20 DIAGNOSIS — M25511 Pain in right shoulder: Secondary | ICD-10-CM | POA: Diagnosis not present

## 2020-02-07 DIAGNOSIS — M67911 Unspecified disorder of synovium and tendon, right shoulder: Secondary | ICD-10-CM | POA: Diagnosis not present

## 2020-02-07 DIAGNOSIS — M75111 Incomplete rotator cuff tear or rupture of right shoulder, not specified as traumatic: Secondary | ICD-10-CM | POA: Diagnosis not present

## 2020-03-08 DIAGNOSIS — R69 Illness, unspecified: Secondary | ICD-10-CM | POA: Diagnosis not present

## 2020-03-20 DIAGNOSIS — M67911 Unspecified disorder of synovium and tendon, right shoulder: Secondary | ICD-10-CM | POA: Diagnosis not present

## 2020-03-20 DIAGNOSIS — M75111 Incomplete rotator cuff tear or rupture of right shoulder, not specified as traumatic: Secondary | ICD-10-CM | POA: Diagnosis not present

## 2020-05-10 DIAGNOSIS — E1165 Type 2 diabetes mellitus with hyperglycemia: Secondary | ICD-10-CM | POA: Diagnosis not present

## 2020-05-10 DIAGNOSIS — Z79899 Other long term (current) drug therapy: Secondary | ICD-10-CM | POA: Diagnosis not present

## 2020-05-10 DIAGNOSIS — T466X5A Adverse effect of antihyperlipidemic and antiarteriosclerotic drugs, initial encounter: Secondary | ICD-10-CM | POA: Diagnosis not present

## 2020-05-10 DIAGNOSIS — E785 Hyperlipidemia, unspecified: Secondary | ICD-10-CM | POA: Diagnosis not present

## 2020-05-10 DIAGNOSIS — J449 Chronic obstructive pulmonary disease, unspecified: Secondary | ICD-10-CM | POA: Diagnosis not present

## 2020-05-10 DIAGNOSIS — Z6821 Body mass index (BMI) 21.0-21.9, adult: Secondary | ICD-10-CM | POA: Diagnosis not present

## 2020-05-10 DIAGNOSIS — I5020 Unspecified systolic (congestive) heart failure: Secondary | ICD-10-CM | POA: Diagnosis not present

## 2020-05-29 ENCOUNTER — Telehealth: Payer: Self-pay | Admitting: Cardiology

## 2020-05-29 MED ORDER — FUROSEMIDE 20 MG PO TABS: 20.0000 mg | ORAL_TABLET | Freq: Every day | ORAL | 3 refills | Status: DC

## 2020-05-29 NOTE — Telephone Encounter (Addendum)
Patient called back to schedule appt with Dr. Dulce Sellar. Please call cell phone to speak with patient

## 2020-05-29 NOTE — Telephone Encounter (Addendum)
Spoke with patient regarding  Recommendation. She is going to call back to schedule an appointment for next week.  Patient verbalizes understanding and is agreeable to plan of care. Advised patient to call back with any issues or concerns.

## 2020-05-29 NOTE — Telephone Encounter (Signed)
I know her very well  She needs to start a diuretic furosemide 20 mg a day  Follow-up office me next week in [place]

## 2020-05-29 NOTE — Telephone Encounter (Signed)
Pt c/o swelling: STAT is pt has developed SOB within 24 hours  1) How much weight have you gained and in what time span? Hasn't noticed any weight gain.  2) If swelling, where is the swelling located? Lower legs, below the knee.  3) Are you currently taking a fluid pill? No.  4) Are you currently SOB? Yes, states its due to allergies.  5) Do you have a log of your daily weights (if so, list)? No.  6) Have you gained 3 pounds in a day or 5 pounds in a week? No.  7) Have you traveled recently? No.   Patient states she has been in bed sick with a sinus infection and since then has noticed her legs swelling. She wants to know if she should get on medication for her swelling. Please advise.

## 2020-05-29 NOTE — Telephone Encounter (Signed)
Spoke to patient just now and got her scheduled to see Dr. Dulce Sellar next week. She verbalizes understanding and thanks me for the call back.

## 2020-06-01 DIAGNOSIS — C801 Malignant (primary) neoplasm, unspecified: Secondary | ICD-10-CM | POA: Insufficient documentation

## 2020-06-01 DIAGNOSIS — E785 Hyperlipidemia, unspecified: Secondary | ICD-10-CM | POA: Insufficient documentation

## 2020-06-01 DIAGNOSIS — C50919 Malignant neoplasm of unspecified site of unspecified female breast: Secondary | ICD-10-CM | POA: Insufficient documentation

## 2020-06-01 DIAGNOSIS — C8190 Hodgkin lymphoma, unspecified, unspecified site: Secondary | ICD-10-CM | POA: Insufficient documentation

## 2020-06-01 DIAGNOSIS — E119 Type 2 diabetes mellitus without complications: Secondary | ICD-10-CM | POA: Insufficient documentation

## 2020-06-04 NOTE — Progress Notes (Signed)
Cardiology Office Note:    Date:  06/06/2020   ID:  Ann Gilmore, DOB 16-Sep-1955, MRN [id number]  PCP:  Janine Limbo, PA-C  Cardiologist:  Shirlee More, MD    Referring MD: Janine Limbo, PA-C    ASSESSMENT:    1. Chronic systolic (congestive) heart failure ([place])   2. Chronic obstructive pulmonary disease, unspecified COPD type ([place])   3. Orthostatic hypotension    PLAN:    In order of problems listed above:  1. Improved adding a minimum dose of diuretic will lower blood pressure reduce every other day continue her minimum dose of Entresto for class effect recheck renal function proBNP and plan to see in the office in 3 months consider repeat echocardiogram around that time 2. Stable COPD 3. Stable asymptomatic   Next appointment: 3 months   Medication Adjustments/Labs and Tests Ordered: Current medicines are reviewed at length with the patient today.  Concerns regarding medicines are outlined above.  No orders of the defined types were placed in this encounter.  No orders of the defined types were placed in this encounter.   She is being seen today with new complaints of edema and restarted a loop diuretic  History of Present Illness:    Ann Gilmore is a 65 y.o. 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  With hypotension she takes a minimal dose directed therapy with Entresto and SGLT2 inhibitor.  She is not on a beta-blocker due to COPD.  She was last seen 07/13/2019.  She phoned the office recently with edema and was placed on a loop diuretic.  Compliance with diet, lifestyle and medications:   Overall she has done much better she is unaware she had edema but has trace today and blood pressure runs 84-13 systolic.  We will keep her on the minimum dose of Entresto for class effect reduce her diuretic to every other day plan to see back in the  office in 3 months.  Recent labs were performed in PCP office 05/10/2020 showed severe dyslipidemia LDL of 215 A1c of 11 creatinine 0.67. Past Medical History:  Diagnosis Date  . Breast cancer ([place])   . Cancer ([place])   . COPD (chronic obstructive pulmonary disease) ([place])   . Diabetes mellitus without complication ([place])   . Hodgkin lymphoma ([place])   . Hyperlipidemia     Past Surgical History:  Procedure Laterality Date  . CARPAL TUNNEL RELEASE    . LIMBAL STEM CELL TRANSPLANT    . MASTECTOMY Bilateral   . OVARIAN CYST SURGERY    . PORTA CATH INSERTION      Current Medications: Current Meds  Medication Sig  . albuterol (PROVENTIL HFA;VENTOLIN HFA) 108 (90 Base) MCG/ACT inhaler INHALE 1 TO 2 PUFFS BY MOUTH EVERY 4 TO 6 HOURS AS NEEDED  . B-D UF III MINI PEN NEEDLES 31G X 5 MM MISC See admin instructions.  . colesevelam (WELCHOL) 625 MG tablet Take 1,250 mg by mouth 2 (two) times daily.  Marland Kitchen FARXIGA 10 MG TABS tablet Take 10 mg by mouth every morning.  . furosemide (LASIX) 20 MG tablet Take 1 tablet (20 mg total) by mouth daily.  . sacubitril-valsartan (ENTRESTO) 24-26 MG Take 1 tablet by mouth twice daily  . SYMBICORT 160-4.5 MCG/ACT inhaler Inhale 1 puff into the lungs 2 (two) times daily.  . [DISCONTINUED] TRESIBA FLEXTOUCH 100 UNIT/ML  SOPN FlexTouch Pen Inject 40 Units into the skin daily.      Allergies:   Other, Oxycodone-acetaminophen, Aspartame and phenylalanine, and Fire ant   Social History   Socioeconomic History  . Marital status: Married    Spouse name: Not on file  . Number of children: Not on file  . Years of education: Not on file  . Highest education level: Not on file  Occupational History  . Not on file  Tobacco Use  . Smoking status: Current Every Day Smoker    Packs/day: 0.25    Years: 40.00    Pack years: 10.00    Types: Cigarettes  . Smokeless tobacco: Never Used  Vaping Use  . Vaping Use: Former  Substance and Sexual Activity  . Alcohol use: Yes     Comment: occ  . Drug use: Not Currently  . Sexual activity: Yes  Other Topics Concern  . Not on file  Social History Narrative  . Not on file   Social Determinants of Health   Financial Resource Strain: Not on file  Food Insecurity: Not on file  Transportation Needs: Not on file  Physical Activity: Not on file  Stress: Not on file  Social Connections: Not on file     Family History: The patient's family history includes Diabetes in her father; Heart attack in her father; Hyperlipidemia in her mother; Hypertension in her mother. ROS:   Please see the history of present illness.    All other systems reviewed and are negative.  EKGs/Labs/Other Studies Reviewed:    The following studies were reviewed today:  EKG:  EKG ordered today and personally reviewed.  The ekg ordered today demonstrates sinus rhythm LVH repolarization changes   Physical Exam:    VS:  Ht 5' 4.5" (1.638 m)   Wt 113 lb 6.4 oz (51.4 kg)   LMP  (LMP Unknown)   BMI 19.16 kg/m     Wt Readings from Last 3 Encounters:  06/06/20 113 lb 6.4 oz (51.4 kg)  07/13/19 135 lb (61.2 kg)  04/01/19 126 lb 12.8 oz (57.5 kg)     GEN: She looks older than her age and chronically ill very thin  in no acute distress HEENT: Normal NECK: No JVD; No carotid bruits LYMPHATICS: No lymphadenopathy CARDIAC: RRR, no murmurs, rubs, gallops distant heart sounds RESPIRATORY: Diffusely diminished breath sounds no rales ABDOMEN: Soft, non-tender, non-distended MUSCULOSKELETAL: Trace pretibial pitting edema; No deformity  SKIN: Warm and dry NEUROLOGIC:  Alert and oriented x 3 PSYCHIATRIC:  Normal affect    Signed, Shirlee More, MD  06/06/2020 1:17 PM    [facility]

## 2020-06-06 ENCOUNTER — Ambulatory Visit: Payer: Medicare HMO | Admitting: Cardiology

## 2020-06-06 ENCOUNTER — Encounter: Payer: Self-pay | Admitting: Cardiology

## 2020-06-06 ENCOUNTER — Other Ambulatory Visit: Payer: Self-pay

## 2020-06-06 VITALS — BP 71/54 | HR 98 | Ht 64.5 in | Wt 113.4 lb

## 2020-06-06 DIAGNOSIS — I951 Orthostatic hypotension: Secondary | ICD-10-CM

## 2020-06-06 DIAGNOSIS — I5022 Chronic systolic (congestive) heart failure: Secondary | ICD-10-CM | POA: Diagnosis not present

## 2020-06-06 DIAGNOSIS — J449 Chronic obstructive pulmonary disease, unspecified: Secondary | ICD-10-CM

## 2020-06-06 MED ORDER — FUROSEMIDE 20 MG PO TABS: 20.0000 mg | ORAL_TABLET | ORAL | 3 refills | Status: DC

## 2020-06-06 NOTE — Patient Instructions (Signed)
Medication Instructions:  Your physician has recommended you make the following change in your medication:  DECREASE: Furosemide to every other day. *If you need a refill on your cardiac medications before your next appointment, please call your pharmacy*   Lab Work: Your physician recommends that you return for lab work in: TODAY  BMP. PRO-BNP If you have labs (blood work) drawn today and your tests are completely normal, you will receive your results only by: Marland Kitchen MyChart Message (if you have MyChart) OR . A paper copy in the mail If you have any lab test that is abnormal or we need to change your treatment, we will call you to review the results.   Testing/Procedures: None   Follow-Up: At [facility], you and your health needs are our priority.  As part of our continuing mission to provide you with exceptional heart care, we have created designated Provider Care Teams.  These Care Teams include your primary Cardiologist (physician) and Advanced Practice Providers (APPs -  Physician Assistants and Nurse Practitioners) who all work together to provide you with the care you need, when you need it.  We recommend signing up for the patient portal called "MyChart".  Sign up information is provided on this After Visit Summary.  MyChart is used to connect with patients for Virtual Visits (Telemedicine).  Patients are able to view lab/test results, encounter notes, upcoming appointments, etc.  Non-urgent messages can be sent to your provider as well.   To learn more about what you can do with MyChart, go to [email or web address].    Your next appointment:   3 month(s)  The format for your next appointment:   In Person  Provider:   Shirlee More, MD   Other Instructions

## 2020-06-07 ENCOUNTER — Telehealth: Payer: Self-pay

## 2020-06-07 LAB — BASIC METABOLIC PANEL
BUN/Creatinine Ratio: 32 — ABNORMAL HIGH (ref 12–28)
BUN: 21 mg/dL (ref 8–27)
CO2: 24 mmol/L (ref 20–29)
Calcium: 9.4 mg/dL (ref 8.7–10.3)
Chloride: 97 mmol/L (ref 96–106)
Creatinine, Ser: 0.66 mg/dL (ref 0.57–1.00)
GFR calc Af Amer: 109 mL/min/{1.73_m2} (ref >59)
GFR calc non Af Amer: 95 mL/min/{1.73_m2} (ref >59)
Glucose: 240 mg/dL — ABNORMAL HIGH (ref 65–99)
Potassium: 4.4 mmol/L (ref 3.5–5.2)
Sodium: 139 mmol/L (ref 134–144)

## 2020-06-07 LAB — PRO B NATRIURETIC PEPTIDE: NT-Pro BNP: 2077 pg/mL — ABNORMAL HIGH (ref 0–287)

## 2020-06-07 NOTE — Telephone Encounter (Signed)
Spoke with patient regarding results and recommendation.  Patient verbalizes understanding and is agreeable to plan of care. Advised patient to call back with any issues or concerns.  

## 2020-07-10 ENCOUNTER — Ambulatory Visit: Payer: Medicare HMO | Admitting: Cardiology

## 2020-08-23 DIAGNOSIS — E785 Hyperlipidemia, unspecified: Secondary | ICD-10-CM | POA: Diagnosis not present

## 2020-08-23 DIAGNOSIS — E1165 Type 2 diabetes mellitus with hyperglycemia: Secondary | ICD-10-CM | POA: Diagnosis not present

## 2020-08-23 DIAGNOSIS — M2550 Pain in unspecified joint: Secondary | ICD-10-CM | POA: Diagnosis not present

## 2020-08-23 DIAGNOSIS — Z6820 Body mass index (BMI) 20.0-20.9, adult: Secondary | ICD-10-CM | POA: Diagnosis not present

## 2020-08-23 DIAGNOSIS — Z79899 Other long term (current) drug therapy: Secondary | ICD-10-CM | POA: Diagnosis not present

## 2020-08-23 DIAGNOSIS — I5020 Unspecified systolic (congestive) heart failure: Secondary | ICD-10-CM | POA: Diagnosis not present

## 2020-08-23 DIAGNOSIS — Z888 Allergy status to other drugs, medicaments and biological substances status: Secondary | ICD-10-CM | POA: Diagnosis not present

## 2020-08-23 DIAGNOSIS — G43909 Migraine, unspecified, not intractable, without status migrainosus: Secondary | ICD-10-CM | POA: Diagnosis not present

## 2020-08-23 DIAGNOSIS — J449 Chronic obstructive pulmonary disease, unspecified: Secondary | ICD-10-CM | POA: Diagnosis not present

## 2020-08-23 DIAGNOSIS — Z9484 Stem cells transplant status: Secondary | ICD-10-CM | POA: Diagnosis not present

## 2020-09-12 ENCOUNTER — Ambulatory Visit: Payer: Medicare HMO | Admitting: Cardiology

## 2020-09-12 ENCOUNTER — Other Ambulatory Visit: Payer: Self-pay

## 2020-09-12 ENCOUNTER — Encounter: Payer: Self-pay | Admitting: Cardiology

## 2020-09-12 VITALS — BP 73/48 | HR 80 | Ht 64.5 in | Wt 125.8 lb

## 2020-09-12 DIAGNOSIS — I951 Orthostatic hypotension: Secondary | ICD-10-CM | POA: Diagnosis not present

## 2020-09-12 DIAGNOSIS — I5022 Chronic systolic (congestive) heart failure: Secondary | ICD-10-CM

## 2020-09-12 DIAGNOSIS — J449 Chronic obstructive pulmonary disease, unspecified: Secondary | ICD-10-CM | POA: Diagnosis not present

## 2020-09-12 DIAGNOSIS — E119 Type 2 diabetes mellitus without complications: Secondary | ICD-10-CM | POA: Diagnosis not present

## 2020-09-12 DIAGNOSIS — Z794 Long term (current) use of insulin: Secondary | ICD-10-CM | POA: Diagnosis not present

## 2020-09-12 MED ORDER — FUROSEMIDE 20 MG PO TABS: 20.0000 mg | ORAL_TABLET | ORAL | 3 refills | Status: AC

## 2020-09-12 MED ORDER — MIDODRINE HCL 5 MG PO TABS: 5.0000 mg | ORAL_TABLET | Freq: Three times a day (TID) | ORAL | 3 refills | Status: DC

## 2020-09-12 NOTE — Patient Instructions (Signed)
Medication Instructions:  Your physician has recommended you make the following change in your medication:  START: Midodrine 5 mg take one tablet by mouth three times daily.   FOR THE NEXT THREE DAYS TAKE YOUR FUROSEMIDE DAILY. THEN RESUME TAKING THIS MEDICATION EVERY OTHER DAY.   *If you need a refill on your cardiac medications before your next appointment, please call your pharmacy*   Lab Work: None If you have labs (blood work) drawn today and your tests are completely normal, you will receive your results only by: Marland Kitchen MyChart Message (if you have MyChart) OR . A paper copy in the mail If you have any lab test that is abnormal or we need to change your treatment, we will call you to review the results.   Testing/Procedures: None   Follow-Up: At [facility], you and your health needs are our priority.  As part of our continuing mission to provide you with exceptional heart care, we have created designated Provider Care Teams.  These Care Teams include your primary Cardiologist (physician) and Advanced Practice Providers (APPs -  Physician Assistants and Nurse Practitioners) who all work together to provide you with the care you need, when you need it.  We recommend signing up for the patient portal called "MyChart".  Sign up information is provided on this After Visit Summary.  MyChart is used to connect with patients for Virtual Visits (Telemedicine).  Patients are able to view lab/test results, encounter notes, upcoming appointments, etc.  Non-urgent messages can be sent to your provider as well.   To learn more about what you can do with MyChart, go to [email or web address].    Your next appointment:   3 month(s)  The format for your next appointment:   In Person  Provider:   Shirlee More, MD   Other Instructions

## 2020-09-12 NOTE — Progress Notes (Signed)
Cardiology Office Note:    Date:  09/12/2020   ID:  Ann Gilmore, DOB 28-Mar-1956, MRN [id number]  PCP:  Janine Limbo, PA-C  Cardiologist:  Shirlee More, MD    Referring MD: Janine Limbo, PA-C    ASSESSMENT:    1. Chronic systolic (congestive) heart failure ([place])   2. Orthostatic hypotension   3. Type 2 diabetes mellitus without complication, with long-term current use of insulin ([place])   4. Chronic obstructive pulmonary disease, unspecified COPD type ([place])    PLAN:    In order of problems listed above:  1. Sent she is taken nonsteroidal anti-inflammatory drugs that may play a role asked her to stop and she cut back on her diuretic.  I am going to ask her to take it daily 3 days and then increase it to every other day.  Ron attempt to keep her on a minimal dose of Entresto with severe LV dysfunction along with her SGLT2 inhibitor she has been intolerant to spironolactone and beta-blockers with severe COPD 2. I see no option but to place her on midodrine she says blood pressure cuff at home is a small cuff she will trend daily and come back for an office nurse visit to check blood pressure 2 weeks 3. Poorly controlled working with her PCP continue SGLT2 inhibitor 4. Stable continue current bronchodilators   Next appointment: 3 months   Medication Adjustments/Labs and Tests Ordered: Current medicines are reviewed at length with the patient today.  Concerns regarding medicines are outlined above.  No orders of the defined types were placed in this encounter.  Meds ordered this encounter  Medications  . midodrine (PROAMATINE) 5 MG tablet    Sig: Take 1 tablet (5 mg total) by mouth 3 (three) times daily with meals.    Dispense:  270 tablet    Refill:  3    Chief Complaint  Patient presents with  . Follow-up  . Congestive Heart Failure  . Cardiomyopathy    History of Present Illness:    Ann Gilmore is a 65 y.o. 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 Compliance with diet, lifestyle and medications: Yes however she is decreased her diuretic to 2 days a week  She has been compliant with her medications and she finds her self a little lightheaded the day after she takes Entresto 2 days a week today is that day.  She has had no syncope on her scale at home her weight is up 3 to 4 pounds but she has marked peripheral edema she is not particularly short of breath no palpitation or syncope she is taking a nonsteroidal anti-inflammatory drug and I encouraged her not to take it as a can cause sodium retention she had recent labs done at her PCP office her A1c is 11.3% creatinine 0.77. Past Medical History:  Diagnosis Date  . Breast cancer ([place])   . Cancer ([place])   . COPD (chronic obstructive pulmonary disease) ([place])   . Diabetes mellitus without complication ([place])   . Hodgkin lymphoma ([place])   . Hyperlipidemia     Past Surgical History:  Procedure Laterality Date  . CARPAL TUNNEL RELEASE    . LIMBAL STEM CELL TRANSPLANT    . MASTECTOMY Bilateral   . OVARIAN CYST SURGERY    . PORTA CATH INSERTION  Current Medications: Current Meds  Medication Sig  . albuterol (PROVENTIL HFA;VENTOLIN HFA) 108 (90 Base) MCG/ACT inhaler every 4 (four) hours as needed for wheezing or shortness of breath.  . B-D UF III MINI PEN NEEDLES 31G X 5 MM MISC See admin instructions.  . cetirizine (ZYRTEC) 10 MG tablet Take 10 mg by mouth daily.  . colesevelam (WELCHOL) 625 MG tablet Take 1,250 mg by mouth 2 (two) times daily.  Marland Kitchen FARXIGA 10 MG TABS tablet Take 10 mg by mouth every morning.  . midodrine (PROAMATINE) 5 MG tablet Take 1 tablet (5 mg total) by mouth 3 (three) times daily with meals.  . sacubitril-valsartan (ENTRESTO) 24-26 MG Take 1 tablet by mouth twice daily  . sulindac (CLINORIL) 150 MG tablet Take  150 mg by mouth 2 (two) times daily as needed for pain.  . SYMBICORT 160-4.5 MCG/ACT inhaler Inhale 1 puff into the lungs 2 (two) times daily.  Tyler Aas FLEXTOUCH 200 UNIT/ML FlexTouch Pen daily.  . [DISCONTINUED] furosemide (LASIX) 20 MG tablet Take 1 tablet (20 mg total) by mouth every other day.     Allergies:   Other, Oxycodone-acetaminophen, Aspartame and phenylalanine, and Fire ant   Social History   Socioeconomic History  . Marital status: Married    Spouse name: Not on file  . Number of children: Not on file  . Years of education: Not on file  . Highest education level: Not on file  Occupational History  . Not on file  Tobacco Use  . Smoking status: Current Every Day Smoker    Packs/day: 0.25    Years: 40.00    Pack years: 10.00    Types: Cigarettes  . Smokeless tobacco: Never Used  Vaping Use  . Vaping Use: Former  Substance and Sexual Activity  . Alcohol use: Yes    Comment: occ  . Drug use: Not Currently  . Sexual activity: Yes  Other Topics Concern  . Not on file  Social History Narrative  . Not on file   Social Determinants of Health   Financial Resource Strain: Not on file  Food Insecurity: Not on file  Transportation Needs: Not on file  Physical Activity: Not on file  Stress: Not on file  Social Connections: Not on file     Family History: The patient's family history includes Diabetes in her father; Heart attack in her father; Hyperlipidemia in her mother; Hypertension in her mother. ROS:   Please see the history of present illness.    All other systems reviewed and are negative.  EKGs/Labs/Other Studies Reviewed:    The following studies were reviewed today:    Recent Labs: 06/06/2020: BUN 21; Creatinine, Ser 0.66; NT-Pro BNP 2,077; Potassium 4.4; Sodium 139    Physical Exam:    VS:  BP (!) 73/48 (BP Location: Left Arm, Patient Position: Sitting)   Pulse 80   Ht 5' 4.5" (1.638 m)   Wt 125 lb 12.8 oz (57.1 kg)   LMP  (LMP Unknown)    SpO2 96%   BMI 21.26 kg/m     Wt Readings from Last 3 Encounters:  09/12/20 125 lb 12.8 oz (57.1 kg)  06/06/20 113 lb 6.4 oz (51.4 kg)  07/13/19 135 lb (61.2 kg)     GEN: Well well nourished, well developed in no acute distress HEENT: Normal NECK: No JVD; No carotid bruits LYMPHATICS: No lymphadenopathy CARDIAC: Distant heart sounds RRR, no murmurs, rubs, gallops RESPIRATORY: Diffusely diminished breath sounds without rales, wheezing or rhonchi  ABDOMEN: Soft, non-tender, non-distended MUSCULOSKELETAL: 2-3+ bilateral lower extremity ankle to knee pitting edema; No deformity  SKIN: Warm and dry NEUROLOGIC:  Alert and oriented x 3 PSYCHIATRIC:  Normal affect    Signed, Shirlee More, MD  09/12/2020 1:29 PM    [facility]

## 2020-09-26 ENCOUNTER — Ambulatory Visit (INDEPENDENT_AMBULATORY_CARE_PROVIDER_SITE_OTHER): Payer: Medicare HMO

## 2020-09-26 ENCOUNTER — Other Ambulatory Visit: Payer: Self-pay

## 2020-09-26 VITALS — BP 90/59 | HR 100 | Ht 64.5 in | Wt 123.0 lb

## 2020-09-26 DIAGNOSIS — I959 Hypotension, unspecified: Secondary | ICD-10-CM | POA: Diagnosis not present

## 2020-09-26 NOTE — Progress Notes (Signed)
Patient came in for nurse visit per Dr. Bettina Gavia for a blood pressure check.   Dr. Harriet Masson reviewed blood pressure and has requested for the patient to come back in 4 weeks for another blood pressure check.   She would also like for the patient to reduce her furosemide to three times weekly on Monday, Wednesday, and Friday.

## 2020-09-26 NOTE — Patient Instructions (Signed)
Medication Instructions:  Your physician has recommended you make the following change in your medication:  DECREASE: Furosemide 20 mg take one tablet by mouth daily on Monday, Wednesday, and Friday only. *If you need a refill on your cardiac medications before your next appointment, please call your pharmacy*   Lab Work: None If you have labs (blood work) drawn today and your tests are completely normal, you will receive your results only by: Marland Kitchen MyChart Message (if you have MyChart) OR . A paper copy in the mail If you have any lab test that is abnormal or we need to change your treatment, we will call you to review the results.   Testing/Procedures: None   Follow-Up: At [facility], you and your health needs are our priority.  As part of our continuing mission to provide you with exceptional heart care, we have created designated Provider Care Teams.  These Care Teams include your primary Cardiologist (physician) and Advanced Practice Providers (APPs -  Physician Assistants and Nurse Practitioners) who all work together to provide you with the care you need, when you need it.  We recommend signing up for the patient portal called "MyChart".  Sign up information is provided on this After Visit Summary.  MyChart is used to connect with patients for Virtual Visits (Telemedicine).  Patients are able to view lab/test results, encounter notes, upcoming appointments, etc.  Non-urgent messages can be sent to your provider as well.   To learn more about what you can do with MyChart, go to [email or web address].    Your next appointment:   4 WEEK NURSE VISIT  The format for your next appointment:   In Person  Provider:   Shirlee More, MD   Other Instructions

## 2020-10-24 ENCOUNTER — Ambulatory Visit (INDEPENDENT_AMBULATORY_CARE_PROVIDER_SITE_OTHER): Payer: Medicare HMO

## 2020-10-24 ENCOUNTER — Other Ambulatory Visit: Payer: Self-pay

## 2020-10-24 VITALS — BP 77/56 | HR 90 | Ht 64.5 in | Wt 121.0 lb

## 2020-10-24 DIAGNOSIS — I9589 Other hypotension: Secondary | ICD-10-CM

## 2020-10-24 MED ORDER — MIDODRINE HCL 10 MG PO TABS: 10.0000 mg | ORAL_TABLET | Freq: Three times a day (TID) | ORAL | 3 refills | Status: AC

## 2020-10-24 NOTE — Patient Instructions (Signed)
Medication Instructions:  Your physician has recommended you make the following change in your medication:  INCREASE: Midodrine 10 mg take one tablet by mouth three times daily.  *If you need a refill on your cardiac medications before your next appointment, please call your pharmacy*   Lab Work: None If you have labs (blood work) drawn today and your tests are completely normal, you will receive your results only by: Marland Kitchen MyChart Message (if you have MyChart) OR . A paper copy in the mail If you have any lab test that is abnormal or we need to change your treatment, we will call you to review the results.   Testing/Procedures: None   Follow-Up: At [facility], you and your health needs are our priority.  As part of our continuing mission to provide you with exceptional heart care, we have created designated Provider Care Teams.  These Care Teams include your primary Cardiologist (physician) and Advanced Practice Providers (APPs -  Physician Assistants and Nurse Practitioners) who all work together to provide you with the care you need, when you need it.  We recommend signing up for the patient portal called "MyChart".  Sign up information is provided on this After Visit Summary.  MyChart is used to connect with patients for Virtual Visits (Telemedicine).  Patients are able to view lab/test results, encounter notes, upcoming appointments, etc.  Non-urgent messages can be sent to your provider as well.   To learn more about what you can do with MyChart, go to [email or web address].    Your next appointment:  Keep scheduled appointment  The format for your next appointment:   In Person  Provider:   Shirlee More, MD   Other Instructions

## 2020-10-24 NOTE — Progress Notes (Signed)
Discussed this patients nurse visit with Dr. Bettina Gavia at this time as her blood pressure I still significantly low. He advised that we should increase her Midodrine 10 mg take one tablet by mouth three times daily. These instructions were printed and reviewed with the patient at this time and she verbalizes understanding. She will follow up with Dr. Bettina Gavia in one month and we will re-evaluate at that time. She will contact us if she develops any symptoms.    Encouraged patient to call back with any questions or concerns.

## 2020-11-01 DIAGNOSIS — R918 Other nonspecific abnormal finding of lung field: Secondary | ICD-10-CM | POA: Diagnosis not present

## 2020-11-01 DIAGNOSIS — E872 Acidosis: Secondary | ICD-10-CM | POA: Diagnosis not present

## 2020-11-01 DIAGNOSIS — J90 Pleural effusion, not elsewhere classified: Secondary | ICD-10-CM | POA: Diagnosis not present

## 2020-11-01 DIAGNOSIS — I517 Cardiomegaly: Secondary | ICD-10-CM | POA: Diagnosis not present

## 2020-11-01 DIAGNOSIS — A419 Sepsis, unspecified organism: Secondary | ICD-10-CM | POA: Diagnosis not present

## 2020-11-01 DIAGNOSIS — E119 Type 2 diabetes mellitus without complications: Secondary | ICD-10-CM | POA: Diagnosis not present

## 2020-11-01 DIAGNOSIS — J449 Chronic obstructive pulmonary disease, unspecified: Secondary | ICD-10-CM | POA: Diagnosis not present

## 2020-11-01 DIAGNOSIS — R69 Illness, unspecified: Secondary | ICD-10-CM | POA: Diagnosis not present

## 2020-11-01 DIAGNOSIS — R6521 Severe sepsis with septic shock: Secondary | ICD-10-CM | POA: Diagnosis not present

## 2020-11-01 DIAGNOSIS — J9690 Respiratory failure, unspecified, unspecified whether with hypoxia or hypercapnia: Secondary | ICD-10-CM | POA: Diagnosis not present

## 2020-11-01 DIAGNOSIS — K668 Other specified disorders of peritoneum: Secondary | ICD-10-CM | POA: Diagnosis not present

## 2020-11-01 DIAGNOSIS — J9811 Atelectasis: Secondary | ICD-10-CM | POA: Diagnosis not present

## 2020-11-01 DIAGNOSIS — J189 Pneumonia, unspecified organism: Secondary | ICD-10-CM | POA: Diagnosis not present

## 2020-11-01 DIAGNOSIS — K631 Perforation of intestine (nontraumatic): Secondary | ICD-10-CM | POA: Diagnosis not present

## 2020-11-01 DIAGNOSIS — R092 Respiratory arrest: Secondary | ICD-10-CM | POA: Diagnosis not present

## 2020-11-02 DIAGNOSIS — Z9911 Dependence on respirator [ventilator] status: Secondary | ICD-10-CM | POA: Diagnosis not present

## 2020-11-02 DIAGNOSIS — K5780 Diverticulitis of intestine, part unspecified, with perforation and abscess without bleeding: Secondary | ICD-10-CM | POA: Diagnosis not present

## 2020-11-02 DIAGNOSIS — J9600 Acute respiratory failure, unspecified whether with hypoxia or hypercapnia: Secondary | ICD-10-CM | POA: Diagnosis not present

## 2020-11-02 DIAGNOSIS — Z9049 Acquired absence of other specified parts of digestive tract: Secondary | ICD-10-CM | POA: Diagnosis not present

## 2020-11-02 DIAGNOSIS — Z933 Colostomy status: Secondary | ICD-10-CM | POA: Diagnosis not present

## 2020-11-02 DIAGNOSIS — J189 Pneumonia, unspecified organism: Secondary | ICD-10-CM | POA: Diagnosis not present

## 2020-11-02 DIAGNOSIS — Z720 Tobacco use: Secondary | ICD-10-CM | POA: Diagnosis not present

## 2020-11-02 DIAGNOSIS — K5720 Diverticulitis of large intestine with perforation and abscess without bleeding: Secondary | ICD-10-CM | POA: Diagnosis not present

## 2020-11-03 DIAGNOSIS — Z9911 Dependence on respirator [ventilator] status: Secondary | ICD-10-CM | POA: Diagnosis not present

## 2020-11-03 DIAGNOSIS — Z933 Colostomy status: Secondary | ICD-10-CM | POA: Diagnosis not present

## 2020-11-03 DIAGNOSIS — J9600 Acute respiratory failure, unspecified whether with hypoxia or hypercapnia: Secondary | ICD-10-CM | POA: Diagnosis not present

## 2020-11-03 DIAGNOSIS — I517 Cardiomegaly: Secondary | ICD-10-CM | POA: Diagnosis not present

## 2020-11-03 DIAGNOSIS — K5720 Diverticulitis of large intestine with perforation and abscess without bleeding: Secondary | ICD-10-CM | POA: Diagnosis not present

## 2020-11-03 DIAGNOSIS — Z9049 Acquired absence of other specified parts of digestive tract: Secondary | ICD-10-CM | POA: Diagnosis not present

## 2020-11-03 DIAGNOSIS — J189 Pneumonia, unspecified organism: Secondary | ICD-10-CM | POA: Diagnosis not present

## 2020-11-03 DIAGNOSIS — Z720 Tobacco use: Secondary | ICD-10-CM | POA: Diagnosis not present

## 2020-11-03 DIAGNOSIS — R0902 Hypoxemia: Secondary | ICD-10-CM | POA: Diagnosis not present

## 2020-11-03 DIAGNOSIS — J90 Pleural effusion, not elsewhere classified: Secondary | ICD-10-CM | POA: Diagnosis not present

## 2020-11-04 DIAGNOSIS — J90 Pleural effusion, not elsewhere classified: Secondary | ICD-10-CM | POA: Diagnosis not present

## 2020-11-04 DIAGNOSIS — Z9049 Acquired absence of other specified parts of digestive tract: Secondary | ICD-10-CM | POA: Diagnosis not present

## 2020-11-04 DIAGNOSIS — I509 Heart failure, unspecified: Secondary | ICD-10-CM | POA: Diagnosis not present

## 2020-11-04 DIAGNOSIS — K5720 Diverticulitis of large intestine with perforation and abscess without bleeding: Secondary | ICD-10-CM | POA: Diagnosis not present

## 2020-11-04 DIAGNOSIS — Z934 Other artificial openings of gastrointestinal tract status: Secondary | ICD-10-CM | POA: Diagnosis not present

## 2020-11-04 DIAGNOSIS — I517 Cardiomegaly: Secondary | ICD-10-CM | POA: Diagnosis not present

## 2020-11-05 DIAGNOSIS — Z9049 Acquired absence of other specified parts of digestive tract: Secondary | ICD-10-CM | POA: Diagnosis not present

## 2020-11-05 DIAGNOSIS — I959 Hypotension, unspecified: Secondary | ICD-10-CM | POA: Diagnosis not present

## 2020-11-05 DIAGNOSIS — J449 Chronic obstructive pulmonary disease, unspecified: Secondary | ICD-10-CM | POA: Diagnosis not present

## 2020-11-05 DIAGNOSIS — Z934 Other artificial openings of gastrointestinal tract status: Secondary | ICD-10-CM | POA: Diagnosis not present

## 2020-11-05 DIAGNOSIS — R918 Other nonspecific abnormal finding of lung field: Secondary | ICD-10-CM | POA: Diagnosis not present

## 2020-11-05 DIAGNOSIS — Z452 Encounter for adjustment and management of vascular access device: Secondary | ICD-10-CM | POA: Diagnosis not present

## 2020-11-05 DIAGNOSIS — I5022 Chronic systolic (congestive) heart failure: Secondary | ICD-10-CM | POA: Diagnosis not present

## 2020-11-05 DIAGNOSIS — K5720 Diverticulitis of large intestine with perforation and abscess without bleeding: Secondary | ICD-10-CM | POA: Diagnosis not present

## 2020-11-05 DIAGNOSIS — Z4682 Encounter for fitting and adjustment of non-vascular catheter: Secondary | ICD-10-CM | POA: Diagnosis not present

## 2020-11-05 DIAGNOSIS — R001 Bradycardia, unspecified: Secondary | ICD-10-CM | POA: Diagnosis not present

## 2020-11-05 DIAGNOSIS — I420 Dilated cardiomyopathy: Secondary | ICD-10-CM | POA: Diagnosis not present

## 2020-11-06 DIAGNOSIS — J449 Chronic obstructive pulmonary disease, unspecified: Secondary | ICD-10-CM | POA: Diagnosis not present

## 2020-11-06 DIAGNOSIS — I5022 Chronic systolic (congestive) heart failure: Secondary | ICD-10-CM | POA: Diagnosis not present

## 2020-11-06 DIAGNOSIS — I420 Dilated cardiomyopathy: Secondary | ICD-10-CM | POA: Diagnosis not present

## 2020-11-06 DIAGNOSIS — J9600 Acute respiratory failure, unspecified whether with hypoxia or hypercapnia: Secondary | ICD-10-CM | POA: Diagnosis not present

## 2020-11-06 DIAGNOSIS — J189 Pneumonia, unspecified organism: Secondary | ICD-10-CM | POA: Diagnosis not present

## 2020-11-06 DIAGNOSIS — Z9911 Dependence on respirator [ventilator] status: Secondary | ICD-10-CM | POA: Diagnosis not present

## 2020-11-06 DIAGNOSIS — Z933 Colostomy status: Secondary | ICD-10-CM | POA: Diagnosis not present

## 2020-11-06 DIAGNOSIS — Z9049 Acquired absence of other specified parts of digestive tract: Secondary | ICD-10-CM | POA: Diagnosis not present

## 2020-11-06 DIAGNOSIS — Z720 Tobacco use: Secondary | ICD-10-CM | POA: Diagnosis not present

## 2020-11-06 DIAGNOSIS — R001 Bradycardia, unspecified: Secondary | ICD-10-CM | POA: Diagnosis not present

## 2020-11-06 DIAGNOSIS — I959 Hypotension, unspecified: Secondary | ICD-10-CM | POA: Diagnosis not present

## 2020-11-06 DIAGNOSIS — K5720 Diverticulitis of large intestine with perforation and abscess without bleeding: Secondary | ICD-10-CM | POA: Diagnosis not present

## 2020-11-07 DIAGNOSIS — I313 Pericardial effusion (noninflammatory): Secondary | ICD-10-CM | POA: Diagnosis not present

## 2020-11-07 DIAGNOSIS — K828 Other specified diseases of gallbladder: Secondary | ICD-10-CM | POA: Diagnosis not present

## 2020-11-07 DIAGNOSIS — Z452 Encounter for adjustment and management of vascular access device: Secondary | ICD-10-CM | POA: Diagnosis not present

## 2020-11-07 DIAGNOSIS — I959 Hypotension, unspecified: Secondary | ICD-10-CM | POA: Diagnosis not present

## 2020-11-07 DIAGNOSIS — Z933 Colostomy status: Secondary | ICD-10-CM | POA: Diagnosis not present

## 2020-11-07 DIAGNOSIS — Z9049 Acquired absence of other specified parts of digestive tract: Secondary | ICD-10-CM | POA: Diagnosis not present

## 2020-11-07 DIAGNOSIS — J449 Chronic obstructive pulmonary disease, unspecified: Secondary | ICD-10-CM | POA: Diagnosis not present

## 2020-11-07 DIAGNOSIS — Z720 Tobacco use: Secondary | ICD-10-CM | POA: Diagnosis not present

## 2020-11-07 DIAGNOSIS — J9690 Respiratory failure, unspecified, unspecified whether with hypoxia or hypercapnia: Secondary | ICD-10-CM | POA: Diagnosis not present

## 2020-11-07 DIAGNOSIS — I420 Dilated cardiomyopathy: Secondary | ICD-10-CM | POA: Diagnosis not present

## 2020-11-07 DIAGNOSIS — Z9911 Dependence on respirator [ventilator] status: Secondary | ICD-10-CM | POA: Diagnosis not present

## 2020-11-07 DIAGNOSIS — J90 Pleural effusion, not elsewhere classified: Secondary | ICD-10-CM | POA: Diagnosis not present

## 2020-11-07 DIAGNOSIS — K5720 Diverticulitis of large intestine with perforation and abscess without bleeding: Secondary | ICD-10-CM | POA: Diagnosis not present

## 2020-11-07 DIAGNOSIS — R001 Bradycardia, unspecified: Secondary | ICD-10-CM | POA: Diagnosis not present

## 2020-11-07 DIAGNOSIS — I700 Atherosclerosis of aorta: Secondary | ICD-10-CM | POA: Diagnosis not present

## 2020-11-07 DIAGNOSIS — R188 Other ascites: Secondary | ICD-10-CM | POA: Diagnosis not present

## 2020-11-07 DIAGNOSIS — J9600 Acute respiratory failure, unspecified whether with hypoxia or hypercapnia: Secondary | ICD-10-CM | POA: Diagnosis not present

## 2020-11-07 DIAGNOSIS — I5022 Chronic systolic (congestive) heart failure: Secondary | ICD-10-CM | POA: Diagnosis not present

## 2020-11-07 DIAGNOSIS — J189 Pneumonia, unspecified organism: Secondary | ICD-10-CM | POA: Diagnosis not present

## 2020-11-08 DIAGNOSIS — J90 Pleural effusion, not elsewhere classified: Secondary | ICD-10-CM | POA: Diagnosis not present

## 2020-11-08 DIAGNOSIS — Z720 Tobacco use: Secondary | ICD-10-CM | POA: Diagnosis not present

## 2020-11-08 DIAGNOSIS — I517 Cardiomegaly: Secondary | ICD-10-CM | POA: Diagnosis not present

## 2020-11-08 DIAGNOSIS — J9690 Respiratory failure, unspecified, unspecified whether with hypoxia or hypercapnia: Secondary | ICD-10-CM | POA: Diagnosis not present

## 2020-11-08 DIAGNOSIS — J9811 Atelectasis: Secondary | ICD-10-CM | POA: Diagnosis not present

## 2020-11-08 DIAGNOSIS — I5022 Chronic systolic (congestive) heart failure: Secondary | ICD-10-CM | POA: Diagnosis not present

## 2020-11-08 DIAGNOSIS — Z9049 Acquired absence of other specified parts of digestive tract: Secondary | ICD-10-CM | POA: Diagnosis not present

## 2020-11-08 DIAGNOSIS — R001 Bradycardia, unspecified: Secondary | ICD-10-CM | POA: Diagnosis not present

## 2020-11-08 DIAGNOSIS — J449 Chronic obstructive pulmonary disease, unspecified: Secondary | ICD-10-CM | POA: Diagnosis not present

## 2020-11-08 DIAGNOSIS — J189 Pneumonia, unspecified organism: Secondary | ICD-10-CM | POA: Diagnosis not present

## 2020-11-08 DIAGNOSIS — Z933 Colostomy status: Secondary | ICD-10-CM | POA: Diagnosis not present

## 2020-11-08 DIAGNOSIS — K5720 Diverticulitis of large intestine with perforation and abscess without bleeding: Secondary | ICD-10-CM | POA: Diagnosis not present

## 2020-11-08 DIAGNOSIS — I420 Dilated cardiomyopathy: Secondary | ICD-10-CM | POA: Diagnosis not present

## 2020-11-08 DIAGNOSIS — J9600 Acute respiratory failure, unspecified whether with hypoxia or hypercapnia: Secondary | ICD-10-CM | POA: Diagnosis not present

## 2020-11-08 DIAGNOSIS — Z9911 Dependence on respirator [ventilator] status: Secondary | ICD-10-CM | POA: Diagnosis not present

## 2020-11-08 DIAGNOSIS — I959 Hypotension, unspecified: Secondary | ICD-10-CM | POA: Diagnosis not present

## 2020-11-09 DIAGNOSIS — Z720 Tobacco use: Secondary | ICD-10-CM | POA: Diagnosis not present

## 2020-11-09 DIAGNOSIS — K5720 Diverticulitis of large intestine with perforation and abscess without bleeding: Secondary | ICD-10-CM | POA: Diagnosis not present

## 2020-11-09 DIAGNOSIS — J9690 Respiratory failure, unspecified, unspecified whether with hypoxia or hypercapnia: Secondary | ICD-10-CM | POA: Diagnosis not present

## 2020-11-09 DIAGNOSIS — I420 Dilated cardiomyopathy: Secondary | ICD-10-CM | POA: Diagnosis not present

## 2020-11-09 DIAGNOSIS — R001 Bradycardia, unspecified: Secondary | ICD-10-CM | POA: Diagnosis not present

## 2020-11-09 DIAGNOSIS — J9600 Acute respiratory failure, unspecified whether with hypoxia or hypercapnia: Secondary | ICD-10-CM | POA: Diagnosis not present

## 2020-11-09 DIAGNOSIS — J189 Pneumonia, unspecified organism: Secondary | ICD-10-CM | POA: Diagnosis not present

## 2020-11-09 DIAGNOSIS — I5022 Chronic systolic (congestive) heart failure: Secondary | ICD-10-CM | POA: Diagnosis not present

## 2020-11-09 DIAGNOSIS — J449 Chronic obstructive pulmonary disease, unspecified: Secondary | ICD-10-CM | POA: Diagnosis not present

## 2020-11-09 DIAGNOSIS — Z933 Colostomy status: Secondary | ICD-10-CM | POA: Diagnosis not present

## 2020-11-09 DIAGNOSIS — Z9049 Acquired absence of other specified parts of digestive tract: Secondary | ICD-10-CM | POA: Diagnosis not present

## 2020-11-09 DIAGNOSIS — Z9911 Dependence on respirator [ventilator] status: Secondary | ICD-10-CM | POA: Diagnosis not present

## 2020-11-09 DIAGNOSIS — I959 Hypotension, unspecified: Secondary | ICD-10-CM | POA: Diagnosis not present

## 2020-11-10 DIAGNOSIS — R001 Bradycardia, unspecified: Secondary | ICD-10-CM | POA: Diagnosis not present

## 2020-11-10 DIAGNOSIS — I959 Hypotension, unspecified: Secondary | ICD-10-CM | POA: Diagnosis not present

## 2020-11-10 DIAGNOSIS — J449 Chronic obstructive pulmonary disease, unspecified: Secondary | ICD-10-CM | POA: Diagnosis not present

## 2020-11-10 DIAGNOSIS — Z720 Tobacco use: Secondary | ICD-10-CM | POA: Diagnosis not present

## 2020-11-10 DIAGNOSIS — Z933 Colostomy status: Secondary | ICD-10-CM | POA: Diagnosis not present

## 2020-11-10 DIAGNOSIS — J189 Pneumonia, unspecified organism: Secondary | ICD-10-CM | POA: Diagnosis not present

## 2020-11-10 DIAGNOSIS — J9690 Respiratory failure, unspecified, unspecified whether with hypoxia or hypercapnia: Secondary | ICD-10-CM | POA: Diagnosis not present

## 2020-11-10 DIAGNOSIS — I5022 Chronic systolic (congestive) heart failure: Secondary | ICD-10-CM | POA: Diagnosis not present

## 2020-11-10 DIAGNOSIS — K5720 Diverticulitis of large intestine with perforation and abscess without bleeding: Secondary | ICD-10-CM | POA: Diagnosis not present

## 2020-11-10 DIAGNOSIS — Z9911 Dependence on respirator [ventilator] status: Secondary | ICD-10-CM | POA: Diagnosis not present

## 2020-11-10 DIAGNOSIS — I420 Dilated cardiomyopathy: Secondary | ICD-10-CM | POA: Diagnosis not present

## 2020-11-10 DIAGNOSIS — J9600 Acute respiratory failure, unspecified whether with hypoxia or hypercapnia: Secondary | ICD-10-CM | POA: Diagnosis not present

## 2020-11-10 DIAGNOSIS — J90 Pleural effusion, not elsewhere classified: Secondary | ICD-10-CM | POA: Diagnosis not present

## 2020-11-10 DIAGNOSIS — Z9049 Acquired absence of other specified parts of digestive tract: Secondary | ICD-10-CM | POA: Diagnosis not present

## 2020-11-11 DIAGNOSIS — E878 Other disorders of electrolyte and fluid balance, not elsewhere classified: Secondary | ICD-10-CM | POA: Diagnosis not present

## 2020-11-11 DIAGNOSIS — I420 Dilated cardiomyopathy: Secondary | ICD-10-CM | POA: Diagnosis not present

## 2020-11-11 DIAGNOSIS — J9600 Acute respiratory failure, unspecified whether with hypoxia or hypercapnia: Secondary | ICD-10-CM | POA: Diagnosis not present

## 2020-11-11 DIAGNOSIS — K5720 Diverticulitis of large intestine with perforation and abscess without bleeding: Secondary | ICD-10-CM | POA: Diagnosis not present

## 2020-11-11 DIAGNOSIS — Z9911 Dependence on respirator [ventilator] status: Secondary | ICD-10-CM | POA: Diagnosis not present

## 2020-11-11 DIAGNOSIS — Z9049 Acquired absence of other specified parts of digestive tract: Secondary | ICD-10-CM | POA: Diagnosis not present

## 2020-11-11 DIAGNOSIS — I739 Peripheral vascular disease, unspecified: Secondary | ICD-10-CM | POA: Diagnosis not present

## 2020-11-11 DIAGNOSIS — A419 Sepsis, unspecified organism: Secondary | ICD-10-CM | POA: Diagnosis not present

## 2020-11-11 DIAGNOSIS — Z720 Tobacco use: Secondary | ICD-10-CM | POA: Diagnosis not present

## 2020-11-11 DIAGNOSIS — M7989 Other specified soft tissue disorders: Secondary | ICD-10-CM | POA: Diagnosis not present

## 2020-11-11 DIAGNOSIS — J189 Pneumonia, unspecified organism: Secondary | ICD-10-CM | POA: Diagnosis not present

## 2020-11-11 DIAGNOSIS — J449 Chronic obstructive pulmonary disease, unspecified: Secondary | ICD-10-CM | POA: Diagnosis not present

## 2020-11-11 DIAGNOSIS — Z933 Colostomy status: Secondary | ICD-10-CM | POA: Diagnosis not present

## 2020-11-11 DIAGNOSIS — J9690 Respiratory failure, unspecified, unspecified whether with hypoxia or hypercapnia: Secondary | ICD-10-CM | POA: Diagnosis not present

## 2020-11-12 DIAGNOSIS — J449 Chronic obstructive pulmonary disease, unspecified: Secondary | ICD-10-CM | POA: Diagnosis not present

## 2020-11-12 DIAGNOSIS — E878 Other disorders of electrolyte and fluid balance, not elsewhere classified: Secondary | ICD-10-CM | POA: Diagnosis not present

## 2020-11-12 DIAGNOSIS — J90 Pleural effusion, not elsewhere classified: Secondary | ICD-10-CM | POA: Diagnosis not present

## 2020-11-12 DIAGNOSIS — J9811 Atelectasis: Secondary | ICD-10-CM | POA: Diagnosis not present

## 2020-11-12 DIAGNOSIS — I420 Dilated cardiomyopathy: Secondary | ICD-10-CM | POA: Diagnosis not present

## 2020-11-12 DIAGNOSIS — J9600 Acute respiratory failure, unspecified whether with hypoxia or hypercapnia: Secondary | ICD-10-CM | POA: Diagnosis not present

## 2020-11-12 DIAGNOSIS — I739 Peripheral vascular disease, unspecified: Secondary | ICD-10-CM | POA: Diagnosis not present

## 2020-11-12 DIAGNOSIS — A419 Sepsis, unspecified organism: Secondary | ICD-10-CM | POA: Diagnosis not present

## 2020-11-12 DIAGNOSIS — K5720 Diverticulitis of large intestine with perforation and abscess without bleeding: Secondary | ICD-10-CM | POA: Diagnosis not present

## 2020-11-12 DIAGNOSIS — J9690 Respiratory failure, unspecified, unspecified whether with hypoxia or hypercapnia: Secondary | ICD-10-CM | POA: Diagnosis not present

## 2020-11-12 DIAGNOSIS — Z9049 Acquired absence of other specified parts of digestive tract: Secondary | ICD-10-CM | POA: Diagnosis not present

## 2020-11-12 DIAGNOSIS — Z933 Colostomy status: Secondary | ICD-10-CM | POA: Diagnosis not present

## 2020-11-13 DIAGNOSIS — I420 Dilated cardiomyopathy: Secondary | ICD-10-CM | POA: Diagnosis not present

## 2020-11-13 DIAGNOSIS — E878 Other disorders of electrolyte and fluid balance, not elsewhere classified: Secondary | ICD-10-CM | POA: Diagnosis not present

## 2020-11-13 DIAGNOSIS — J449 Chronic obstructive pulmonary disease, unspecified: Secondary | ICD-10-CM | POA: Diagnosis not present

## 2020-11-13 DIAGNOSIS — J189 Pneumonia, unspecified organism: Secondary | ICD-10-CM | POA: Diagnosis not present

## 2020-11-13 DIAGNOSIS — Z9911 Dependence on respirator [ventilator] status: Secondary | ICD-10-CM | POA: Diagnosis not present

## 2020-11-13 DIAGNOSIS — A419 Sepsis, unspecified organism: Secondary | ICD-10-CM | POA: Diagnosis not present

## 2020-11-13 DIAGNOSIS — Z9049 Acquired absence of other specified parts of digestive tract: Secondary | ICD-10-CM | POA: Diagnosis not present

## 2020-11-13 DIAGNOSIS — K5720 Diverticulitis of large intestine with perforation and abscess without bleeding: Secondary | ICD-10-CM | POA: Diagnosis not present

## 2020-11-13 DIAGNOSIS — I739 Peripheral vascular disease, unspecified: Secondary | ICD-10-CM | POA: Diagnosis not present

## 2020-11-13 DIAGNOSIS — Z720 Tobacco use: Secondary | ICD-10-CM | POA: Diagnosis not present

## 2020-11-13 DIAGNOSIS — Z933 Colostomy status: Secondary | ICD-10-CM | POA: Diagnosis not present

## 2020-11-13 DIAGNOSIS — J9600 Acute respiratory failure, unspecified whether with hypoxia or hypercapnia: Secondary | ICD-10-CM | POA: Diagnosis not present

## 2020-11-14 DIAGNOSIS — E878 Other disorders of electrolyte and fluid balance, not elsewhere classified: Secondary | ICD-10-CM | POA: Diagnosis not present

## 2020-11-14 DIAGNOSIS — Z720 Tobacco use: Secondary | ICD-10-CM | POA: Diagnosis not present

## 2020-11-14 DIAGNOSIS — A419 Sepsis, unspecified organism: Secondary | ICD-10-CM | POA: Diagnosis not present

## 2020-11-14 DIAGNOSIS — J9811 Atelectasis: Secondary | ICD-10-CM | POA: Diagnosis not present

## 2020-11-14 DIAGNOSIS — Z933 Colostomy status: Secondary | ICD-10-CM | POA: Diagnosis not present

## 2020-11-14 DIAGNOSIS — K5720 Diverticulitis of large intestine with perforation and abscess without bleeding: Secondary | ICD-10-CM | POA: Diagnosis not present

## 2020-11-14 DIAGNOSIS — J9600 Acute respiratory failure, unspecified whether with hypoxia or hypercapnia: Secondary | ICD-10-CM | POA: Diagnosis not present

## 2020-11-14 DIAGNOSIS — I739 Peripheral vascular disease, unspecified: Secondary | ICD-10-CM | POA: Diagnosis not present

## 2020-11-14 DIAGNOSIS — I420 Dilated cardiomyopathy: Secondary | ICD-10-CM | POA: Diagnosis not present

## 2020-11-14 DIAGNOSIS — J9690 Respiratory failure, unspecified, unspecified whether with hypoxia or hypercapnia: Secondary | ICD-10-CM | POA: Diagnosis not present

## 2020-11-14 DIAGNOSIS — Z9049 Acquired absence of other specified parts of digestive tract: Secondary | ICD-10-CM | POA: Diagnosis not present

## 2020-11-14 DIAGNOSIS — J90 Pleural effusion, not elsewhere classified: Secondary | ICD-10-CM | POA: Diagnosis not present

## 2020-11-14 DIAGNOSIS — Z9911 Dependence on respirator [ventilator] status: Secondary | ICD-10-CM | POA: Diagnosis not present

## 2020-11-14 DIAGNOSIS — J449 Chronic obstructive pulmonary disease, unspecified: Secondary | ICD-10-CM | POA: Diagnosis not present

## 2020-11-14 DIAGNOSIS — J189 Pneumonia, unspecified organism: Secondary | ICD-10-CM | POA: Diagnosis not present

## 2020-11-15 DIAGNOSIS — J90 Pleural effusion, not elsewhere classified: Secondary | ICD-10-CM | POA: Diagnosis not present

## 2020-11-15 DIAGNOSIS — L7632 Postprocedural hematoma of skin and subcutaneous tissue following other procedure: Secondary | ICD-10-CM | POA: Diagnosis not present

## 2020-11-15 DIAGNOSIS — I959 Hypotension, unspecified: Secondary | ICD-10-CM | POA: Diagnosis not present

## 2020-11-15 DIAGNOSIS — Z720 Tobacco use: Secondary | ICD-10-CM | POA: Diagnosis not present

## 2020-11-15 DIAGNOSIS — I420 Dilated cardiomyopathy: Secondary | ICD-10-CM | POA: Diagnosis not present

## 2020-11-15 DIAGNOSIS — Z933 Colostomy status: Secondary | ICD-10-CM | POA: Diagnosis not present

## 2020-11-15 DIAGNOSIS — J9600 Acute respiratory failure, unspecified whether with hypoxia or hypercapnia: Secondary | ICD-10-CM | POA: Diagnosis not present

## 2020-11-15 DIAGNOSIS — Z9911 Dependence on respirator [ventilator] status: Secondary | ICD-10-CM | POA: Diagnosis not present

## 2020-11-15 DIAGNOSIS — J449 Chronic obstructive pulmonary disease, unspecified: Secondary | ICD-10-CM | POA: Diagnosis not present

## 2020-11-15 DIAGNOSIS — J9690 Respiratory failure, unspecified, unspecified whether with hypoxia or hypercapnia: Secondary | ICD-10-CM | POA: Diagnosis not present

## 2020-11-15 DIAGNOSIS — R6339 Other feeding difficulties: Secondary | ICD-10-CM | POA: Diagnosis not present

## 2020-11-15 DIAGNOSIS — K5720 Diverticulitis of large intestine with perforation and abscess without bleeding: Secondary | ICD-10-CM

## 2020-11-15 DIAGNOSIS — J189 Pneumonia, unspecified organism: Secondary | ICD-10-CM | POA: Diagnosis not present

## 2020-11-15 DIAGNOSIS — Z9049 Acquired absence of other specified parts of digestive tract: Secondary | ICD-10-CM | POA: Diagnosis not present

## 2020-11-16 DIAGNOSIS — J9690 Respiratory failure, unspecified, unspecified whether with hypoxia or hypercapnia: Secondary | ICD-10-CM | POA: Diagnosis not present

## 2020-11-16 DIAGNOSIS — K5720 Diverticulitis of large intestine with perforation and abscess without bleeding: Secondary | ICD-10-CM | POA: Diagnosis not present

## 2020-11-16 DIAGNOSIS — Z9049 Acquired absence of other specified parts of digestive tract: Secondary | ICD-10-CM | POA: Diagnosis not present

## 2020-11-16 DIAGNOSIS — Z933 Colostomy status: Secondary | ICD-10-CM | POA: Diagnosis not present

## 2020-11-16 DIAGNOSIS — J90 Pleural effusion, not elsewhere classified: Secondary | ICD-10-CM | POA: Diagnosis not present

## 2020-11-24 DEATH — deceased

## 2020-12-12 ENCOUNTER — Ambulatory Visit: Payer: Medicare HMO | Admitting: Cardiology
